# Patient Record
Sex: Female | Born: 1975 | Race: Black or African American | Hispanic: No | Marital: Single | Smoking: Former smoker
Health system: Southern US, Community
[De-identification: ages and names within clinical notes are randomized; demographics above are authoritative.]

## PROBLEM LIST (undated history)

## (undated) DIAGNOSIS — J36 Peritonsillar abscess: Secondary | ICD-10-CM

---

## 1998-02-07 ENCOUNTER — Inpatient Hospital Stay (HOSPITAL_COMMUNITY): Admission: AD | Admit: 1998-02-07 | Discharge: 1998-02-07 | Payer: Self-pay | Admitting: Obstetrics

## 1998-11-14 ENCOUNTER — Emergency Department (HOSPITAL_COMMUNITY): Admission: EM | Admit: 1998-11-14 | Discharge: 1998-11-14 | Payer: Self-pay | Admitting: *Deleted

## 1999-03-19 ENCOUNTER — Emergency Department (HOSPITAL_COMMUNITY): Admission: EM | Admit: 1999-03-19 | Discharge: 1999-03-19 | Payer: Self-pay | Admitting: Emergency Medicine

## 1999-12-14 ENCOUNTER — Emergency Department (HOSPITAL_COMMUNITY): Admission: EM | Admit: 1999-12-14 | Discharge: 1999-12-14 | Payer: Self-pay | Admitting: Emergency Medicine

## 1999-12-14 ENCOUNTER — Encounter: Payer: Self-pay | Admitting: Emergency Medicine

## 2000-04-27 ENCOUNTER — Emergency Department (HOSPITAL_COMMUNITY): Admission: EM | Admit: 2000-04-27 | Discharge: 2000-04-27 | Payer: Self-pay | Admitting: Emergency Medicine

## 2000-04-27 ENCOUNTER — Encounter: Payer: Self-pay | Admitting: Emergency Medicine

## 2000-07-06 ENCOUNTER — Ambulatory Visit (HOSPITAL_COMMUNITY): Admission: RE | Admit: 2000-07-06 | Discharge: 2000-07-06 | Payer: Self-pay | Admitting: *Deleted

## 2000-10-11 ENCOUNTER — Ambulatory Visit (HOSPITAL_COMMUNITY): Admission: RE | Admit: 2000-10-11 | Discharge: 2000-10-11 | Payer: Self-pay | Admitting: *Deleted

## 2000-12-03 ENCOUNTER — Inpatient Hospital Stay (HOSPITAL_COMMUNITY): Admission: AD | Admit: 2000-12-03 | Discharge: 2000-12-06 | Payer: Self-pay | Admitting: Obstetrics & Gynecology

## 2011-11-12 ENCOUNTER — Encounter (HOSPITAL_COMMUNITY): Payer: Self-pay | Admitting: Emergency Medicine

## 2011-11-12 ENCOUNTER — Emergency Department (HOSPITAL_COMMUNITY)
Admission: EM | Admit: 2011-11-12 | Discharge: 2011-11-12 | Disposition: A | Discharge status: Home / Self Care | Payer: Self-pay | Source: Home / Self Care | Attending: Emergency Medicine | Admitting: Emergency Medicine

## 2011-11-12 DIAGNOSIS — N12 Tubulo-interstitial nephritis, not specified as acute or chronic: Secondary | ICD-10-CM

## 2011-11-12 DIAGNOSIS — M549 Dorsalgia, unspecified: Secondary | ICD-10-CM

## 2011-11-12 MED ORDER — KETOROLAC TROMETHAMINE 60 MG/2ML IM SOLN
60.0000 mg | Freq: Once | INTRAMUSCULAR | Status: AC
  Administered 2011-11-12: 60 mg via INTRAMUSCULAR

## 2011-11-12 MED ORDER — NAPROXEN 500 MG PO TABS: 500.0000 mg | ORAL_TABLET | Freq: Two times a day (BID) | ORAL | Status: DC

## 2011-11-12 MED ORDER — KETOROLAC TROMETHAMINE 60 MG/2ML IM SOLN
INTRAMUSCULAR | Status: AC
  Filled 2011-11-12: qty 2

## 2011-11-12 MED ORDER — CIPROFLOXACIN HCL 500 MG PO TABS: 500.0000 mg | ORAL_TABLET | Freq: Two times a day (BID) | ORAL | Status: DC

## 2011-11-12 NOTE — ED Provider Notes (Signed)
Medical screening examination/treatment/procedure(s) were performed by non-physician practitioner and as supervising physician I was immediately available for consultation/collaboration.  Tamirra Sienkiewicz   Jeron Grahn, MD 11/12/11 1553 

## 2011-11-12 NOTE — ED Provider Notes (Signed)
History     CSN: [id number]  Arrival date & time 11/12/11  1238   None     Chief Complaint  Patient presents with  . Back Injury    pt slumped over bed. states that she was sweeping her stairs at home and her back gave out on her. "sharp shooting pains starting in mid/lower back"    (Consider location/radiation/quality/duration/timing/severity/associated sxs/prior treatment) Patient is a 36 y.o. female presenting with back pain. The history is provided by the patient.  Back Pain  This is a new problem. The current episode started more than 2 days ago (3 days ago, sweeping on steps). The problem has been gradually worsening. The pain is associated with no known injury. The pain is present in the lumbar spine. The quality of the pain is described as shooting (sharp). The pain does not radiate. The pain is moderate. The symptoms are aggravated by bending, twisting and certain positions. The pain is the same all the time. Stiffness is present in the morning. Pertinent negatives include no numbness, no headaches, no abdominal pain, no bowel incontinence, no perianal numbness, no bladder incontinence, no dysuria, no pelvic pain, no leg pain, no paresthesias, no paresis, no tingling and no weakness. She has tried NSAIDs for the symptoms. The treatment provided mild relief. 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  Admits to urinary frequency and pelvic pressure. History reviewed. No pertinent past medical history.  Past Surgical History  Procedure Date  . Cesarean section     History reviewed. No pertinent family history.  History  Substance Use Topics  . Smoking status: Current Every Day Smoker -- 0.5 packs/day    Types: Cigarettes  . Smokeless tobacco: Not on file  . Alcohol Use: Yes     Comment: occasional    OB History    Grav Para Term Preterm Abortions TAB SAB Ect Mult Living                  Review of Systems  Constitutional: Negative.     HENT: Negative.   Respiratory: Negative.   Cardiovascular: Negative.   Gastrointestinal: Negative for abdominal pain and bowel incontinence.  Genitourinary: Negative.  Negative for bladder incontinence, dysuria and pelvic pain.  Musculoskeletal: Positive for back pain and arthralgias.  Skin: Negative.   Neurological: Negative.  Negative for tingling, weakness, numbness, headaches and paresthesias.    Allergies  Review of patient's allergies indicates no known allergies.  Home Medications   Current Outpatient Rx  Name  Route  Sig  Dispense  Refill  . CIPROFLOXACIN HCL 500 MG PO TABS   Oral   Take 1 tablet (500 mg total) by mouth every 12 (twelve) hours.   14 tablet   0   . NAPROXEN 500 MG PO TABS   Oral   Take 1 tablet (500 mg total) by mouth 2 (two) times daily.   30 tablet   0     BP 148/75  Pulse 76  Temp 99.5 F (37.5 C) (Oral)  Resp 20  SpO2 100%  LMP 10/16/2011  Physical Exam  Nursing note and vitals reviewed. Constitutional: She is oriented to person, place, and time. Vital signs are normal. She appears well-developed and well-nourished. She is active and cooperative.  HENT:  Head: Normocephalic.  Mouth/Throat: Oropharynx is clear and moist. No oropharyngeal exudate.  Eyes: Conjunctivae normal are normal. Pupils are equal, round, and reactive to light. No scleral icterus.  Neck: Trachea  normal and normal range of motion. Neck supple.  Cardiovascular: Normal rate, regular rhythm, normal heart sounds and intact distal pulses.   No murmur heard. Pulmonary/Chest: Effort normal and breath sounds normal.  Abdominal: Soft. Bowel sounds are normal. There is no tenderness. There is CVA tenderness. There is no rebound.       Right cva tenderness  Lymphadenopathy:    She has no cervical adenopathy.  Neurological: She is alert and oriented to person, place, and time. No cranial nerve deficit or sensory deficit.  Skin: Skin is warm and dry.  Psychiatric: She has a  normal mood and affect. Her speech is normal and behavior is normal. Judgment and thought content normal. Cognition and memory are normal.    ED Course  Procedures (including critical care time)  Labs Reviewed - No data to display No results found.  uine dip-bil small, ket trace, pro 30, leu trace.  1. Pyelonephritis   2. Back pain       MDM  Increase fluid intake, follow up with pcp if symptoms are not improving.          Johnsie Kindred, NP 11/12/11 1454  Johnsie Kindred, NP 11/12/11 1507

## 2011-11-12 NOTE — ED Notes (Signed)
Pt c/o mid to lower back pain since Friday. "back gave out when sweeping stairs at home"  Pain is a sharp shooting pain. Pt has tried otc with no relief of pain.   Severe pain with any movement.  Pt is slumped over exam table unable to move.

## 2011-11-12 NOTE — ED Notes (Signed)
Waiting discharge papers 

## 2011-11-13 LAB — POCT URINALYSIS DIP (DEVICE)
Hgb urine dipstick: NEGATIVE
Protein, ur: 30 mg/dL — AB
Specific Gravity, Urine: 1.025 (ref 1.005–1.030)

## 2012-11-05 ENCOUNTER — Encounter (HOSPITAL_COMMUNITY): Payer: Self-pay | Admitting: Emergency Medicine

## 2012-11-05 ENCOUNTER — Emergency Department (HOSPITAL_COMMUNITY)
Admission: EM | Admit: 2012-11-05 | Discharge: 2012-11-05 | Disposition: A | Discharge status: Home / Self Care | Payer: Self-pay | Attending: Emergency Medicine | Admitting: Emergency Medicine

## 2012-11-05 ENCOUNTER — Emergency Department (INDEPENDENT_AMBULATORY_CARE_PROVIDER_SITE_OTHER)
Admission: EM | Admit: 2012-11-05 | Discharge: 2012-11-05 | Disposition: A | Discharge status: Nursing Home (Includes ICF) | Payer: Self-pay | Source: Home / Self Care | Attending: Family Medicine | Admitting: Family Medicine

## 2012-11-05 DIAGNOSIS — R1310 Dysphagia, unspecified: Secondary | ICD-10-CM | POA: Insufficient documentation

## 2012-11-05 DIAGNOSIS — J3489 Other specified disorders of nose and nasal sinuses: Secondary | ICD-10-CM | POA: Insufficient documentation

## 2012-11-05 DIAGNOSIS — F17200 Nicotine dependence, unspecified, uncomplicated: Secondary | ICD-10-CM | POA: Insufficient documentation

## 2012-11-05 DIAGNOSIS — R509 Fever, unspecified: Secondary | ICD-10-CM | POA: Insufficient documentation

## 2012-11-05 DIAGNOSIS — J36 Peritonsillar abscess: Secondary | ICD-10-CM

## 2012-11-05 DIAGNOSIS — J390 Retropharyngeal and parapharyngeal abscess: Secondary | ICD-10-CM

## 2012-11-05 HISTORY — DX: Peritonsillar abscess: J36

## 2012-11-05 LAB — POCT RAPID STREP A: Streptococcus, Group A Screen (Direct): POSITIVE — AB

## 2012-11-05 MED ORDER — SODIUM CHLORIDE 0.9 % IV BOLUS (SEPSIS)
1000.0000 mL | Freq: Once | INTRAVENOUS | Status: AC
  Administered 2012-11-05: 1000 mL via INTRAVENOUS

## 2012-11-05 MED ORDER — OXYCODONE-ACETAMINOPHEN 5-325 MG PO TABS: 1.0000 | ORAL_TABLET | ORAL | Status: DC | PRN

## 2012-11-05 MED ORDER — CLINDAMYCIN PHOSPHATE 600 MG/50ML IV SOLN
600.0000 mg | Freq: Once | INTRAVENOUS | Status: AC
  Administered 2012-11-05: 600 mg via INTRAVENOUS
  Filled 2012-11-05: qty 50

## 2012-11-05 MED ORDER — LIDOCAINE HCL (PF) 2 % IJ SOLN: 2.0000 mL | Freq: Once | INTRAMUSCULAR | Status: DC

## 2012-11-05 MED ORDER — KETOROLAC TROMETHAMINE 30 MG/ML IJ SOLN
30.0000 mg | Freq: Once | INTRAMUSCULAR | Status: AC
  Administered 2012-11-05: 30 mg via INTRAVENOUS
  Filled 2012-11-05: qty 1

## 2012-11-05 MED ORDER — DEXAMETHASONE SODIUM PHOSPHATE 4 MG/ML IJ SOLN
4.0000 mg | Freq: Once | INTRAMUSCULAR | Status: AC
  Administered 2012-11-05: 4 mg via INTRAVENOUS
  Filled 2012-11-05: qty 1

## 2012-11-05 MED ORDER — PENICILLIN G BENZATHINE 1200000 UNIT/2ML IM SUSP: 1.2000 10*6.[IU] | Freq: Once | INTRAMUSCULAR | Status: DC

## 2012-11-05 MED ORDER — FENTANYL CITRATE 0.05 MG/ML IJ SOLN
50.0000 ug | Freq: Once | INTRAMUSCULAR | Status: AC
  Administered 2012-11-05: 50 ug via INTRAVENOUS
  Filled 2012-11-05: qty 2

## 2012-11-05 MED ORDER — CLINDAMYCIN HCL 150 MG PO CAPS: 300.0000 mg | ORAL_CAPSULE | Freq: Four times a day (QID) | ORAL | Status: DC

## 2012-11-05 MED ORDER — ACETAMINOPHEN 325 MG PO TABS
650.0000 mg | ORAL_TABLET | Freq: Once | ORAL | Status: AC
  Administered 2012-11-05: 650 mg via ORAL
  Filled 2012-11-05: qty 2

## 2012-11-05 NOTE — ED Notes (Signed)
[facility], having sore throat, bodyaches and fever x 3-4 days, not eating. +strep at [facility] but sent here due to possible peritonsillar abscess, has hx of same. Pt denies any sob but pt barely able to speak or swallow saliva due to pain. Airway intact at this time.

## 2012-11-05 NOTE — ED Provider Notes (Signed)
CSN: [id number]     Arrival date & time 11/05/12  1743 History   First MD Initiated Contact with Patient 11/05/12 1856     Chief Complaint  Patient presents with  . Sore Throat   (Consider location/radiation/quality/duration/timing/severity/associated sxs/prior Treatment) The history is provided by the patient.   Melissa Soto is a 37 y.o. female patient with flulike symptoms for approximately 5 days, states sore throat for 2 days. States that it is painful to swallow mainly in the right side. Patient went to urgent care today and was told that she had strep throat and possible tonsillar abscess. Patient was sent here for further evaluation. Patient states she has history of the same and states that she has had to have a tonsillar abscesses drained in the past. Patient states that she still able to speak and swallow, denies any respiratory problems. Patient denies taking any medications prior to coming in. States swallowing and speaking making her symptoms worse. Denies anything making it better.  Past Medical History  Diagnosis Date  . Peritonsillar abscess    Past Surgical History  Procedure Laterality Date  . Cesarean section     History reviewed. No pertinent family history. History  Substance Use Topics  . Smoking status: Current Every Day Smoker -- 0.50 packs/day    Types: Cigarettes  . Smokeless tobacco: Not on file  . Alcohol Use: Yes     Comment: occasional   OB History   Grav Para Term Preterm Abortions TAB SAB Ect Mult Living                 Review of Systems  Constitutional: Positive for fever and chills.  HENT: Positive for congestion, sore throat, trouble swallowing and voice change. Negative for dental problem, drooling and postnasal drip.   Respiratory: Negative for cough, chest tightness and shortness of breath.   Cardiovascular: Negative for chest pain, palpitations and leg swelling.  Gastrointestinal: Negative for vomiting.  Genitourinary: Negative for  dysuria, flank pain, vaginal bleeding, vaginal discharge, vaginal pain and pelvic pain.  Musculoskeletal: Negative for arthralgias, myalgias, neck pain and neck stiffness.  Skin: Negative for rash.  Neurological: Negative for dizziness, weakness and headaches.  All other systems reviewed and are negative.    Allergies  Review of patient's allergies indicates no known allergies.  Home Medications  No current outpatient prescriptions on file. BP 138/63  Pulse 90  Temp(Src) 99.7 F (37.6 C) (Oral)  Resp 16  Wt 231 lb 9 oz (105.036 kg)  SpO2 100%  LMP 11/05/2012 Physical Exam  Nursing note and vitals reviewed. Constitutional: She is oriented to person, place, and time. She appears well-developed and well-nourished. No distress.  HENT:  Head: Normocephalic and atraumatic.  Right Ear: External ear normal.  Left Ear: External ear normal.  Nose: Nose normal.  Mouth/Throat: Oropharyngeal exudate present.  The right tonsil is markedly enlarged, exudate present. It extends over and across midline. Uvula is deviated to the left. Trismus is present. No swelling under the tongue or floor submandibular area  Eyes: Conjunctivae are normal.  Neck: Normal range of motion. Neck supple.  Cardiovascular: Normal rate, regular rhythm and normal heart sounds.   Pulmonary/Chest: Effort normal and breath sounds normal. No respiratory distress. She has no wheezes. She has no rales.  Musculoskeletal: She exhibits no edema.  Neurological: She is alert and oriented to person, place, and time.  Skin: Skin is warm and dry.  Psychiatric: She has a normal mood and affect. Her behavior  is normal.    ED Course  Procedures (including critical care time) Labs Review Labs Reviewed - No data to display Imaging Review No results found.  EKG Interpretation   None       MDM   1. Peritonsillar abscess     Patient's with a right peritonsillar abscess based on the exam. Incised and drained at bedside by  Dr. Jodi Mourning. 8 cc of purulent drainage obtained. Patient was hydrated, clindamycin 600mg  IV given, pain medication and Decadron given. Patient feels much better. She will be discharged home with followup with ear nose throat physicians. Her strep was positive. Home on Clindamycin.  Filed Vitals:   11/05/12 2130 11/05/12 2215 11/05/12 2300 11/05/12 2336  BP: 145/74 136/70 134/64   Pulse: 78 84 83   Temp:    99.8 F (37.7 C)  TempSrc:    Oral  Resp:      Weight:      SpO2: 100% 100% 99%        Lottie Mussel, PA-C 11/06/12 0133  Medical screening examination/treatment/procedure(s) were conducted as a shared visit with non-physician practitioner(s) or resident and myself. I personally evaluated the patient during the encounter and agree with the findings and plan unless otherwise indicated.  I have personally reviewed any xrays and/ or EKG's with the provider and I agree with interpretation.  Sore throat for 3-4 days, similar to previous pharyngitis/ abscess. Hx of PTA. Pain with swallowing. Exam neck supple, protrusion right posterior pharynx with tonsillar exudate, cervical lymphadenopathy, mild trismus. No tongue or submandibular swelling. Clinically PTA, plan for needle drainage with 18 g.  Consent. [place] at bedside.  8 ml pus removed. Pt improved significantly in ED.   EMERGENCY DEPARTMENT US SOFT TISSUE INTERPRETATION "Study: Limited Soft Tissue Ultrasound"  INDICATIONS: Pain and Soft tissue infection peritonsillar abscess Multiple views of the body part were obtained in real-time with a multi-frequency linear probe PERFORMED BY:  Myself IMAGES ARCHIVED?: Yes SIDE:Right  BODY PART:Other soft tisse (comment in note) pharynx FINDINGS: Abcess present INTERPRETATION:  Abcess present PTA  INCISION AND DRAINAGE PERITONSILLAR ABSCESS DRAINAGE Performed by: Enid Skeens Consent: Verbal consent obtained. Risks and benefits: risks, benefits and alternatives were  discussed Type: abscess  Body area: right peritonsillar Anesthesia: local infiltration Incision was made with a scalpel. Local anesthetic: lidocaine Anesthetic total: lidocaine neb with local hurricaine spray Complexity: complex 18 gauge needle wit protective cap cut at 1.5 cm from needle tip advanced parallel to midline of body.  8 ml of pus withdrawn on two attempts. Drainage: 8 ml pus  Patient tolerance: Patient tolerated the procedure well with no immediate complications.  Peritonsillar abscess, Incision and drainage     Enid Skeens, MD 11/06/12 [phone]

## 2012-11-05 NOTE — ED Notes (Signed)
Patient making phone calls to family member, letting them know  what's going on .

## 2012-11-05 NOTE — ED Provider Notes (Signed)
Melissa Soto is a 37 y.o. female who presents to Urgent Care today for sore throat subjective fever and body aches starting 3 days ago. Patient has pain and swelling. She has a history of right peritonsillar abscess. She denies any nausea vomiting diarrhea or trouble breathing. She feels well otherwise.   No past medical history on file. History  Substance Use Topics  . Smoking status: Current Every Day Smoker -- 0.50 packs/day    Types: Cigarettes  . Smokeless tobacco: Not on file  . Alcohol Use: Yes     Comment: occasional   ROS as above Medications reviewed. No current facility-administered medications for this encounter.   Current Outpatient Prescriptions  Medication Sig Dispense Refill  . ciprofloxacin (CIPRO) 500 MG tablet Take 1 tablet (500 mg total) by mouth every 12 (twelve) hours.  14 tablet  0  . naproxen (NAPROSYN) 500 MG tablet Take 1 tablet (500 mg total) by mouth 2 (two) times daily.  30 tablet  0    Exam:  BP 124/86  Pulse 82  Temp(Src) 98.8 F (37.1 C) (Oral)  Resp 16  SpO2 100% Gen: Well NAD HEENT: EOMI,  MMM, right peritonsillar area significantly swollen with midline deviation of the uvula. The swelling seems to extend posterior possibly into the retropharyngeal space.  Lungs: CTABL Nl WOB Heart: RRR no MRG Abd: NABS, NT, ND Exts: Non edematous BL  LE, warm and well perfused.   Results for orders placed during the hospital encounter of 11/05/12 (from the past 24 hour(s))  POCT RAPID STREP A ([facility] ONLY)     Status: Abnormal   Collection Time    11/05/12  4:29 PM      Result Value Range   Streptococcus, Group A Screen (Direct) POSITIVE (*) NEGATIVE   No results found.  Assessment and Plan: 37 y.o. female with right peritonsillar abscess versus retropharyngeal abscess.  Plan: Transfer to the ER via shuttle for imaging and IV antibiotics. Call ENT if needed Dr. Jearld Fenton is on call [phone]. Discussed warning signs or symptoms. Please see  discharge instructions. Patient expresses understanding.      Rodolph Bong, MD 11/05/12 [phone]

## 2014-12-01 ENCOUNTER — Emergency Department (INDEPENDENT_AMBULATORY_CARE_PROVIDER_SITE_OTHER): Payer: Medicaid Other

## 2014-12-01 ENCOUNTER — Emergency Department (HOSPITAL_COMMUNITY)
Admission: EM | Admit: 2014-12-01 | Discharge: 2014-12-01 | Disposition: A | Discharge status: Home / Self Care | Payer: Medicaid Other | Source: Home / Self Care | Attending: Emergency Medicine | Admitting: Emergency Medicine

## 2014-12-01 ENCOUNTER — Encounter (HOSPITAL_COMMUNITY): Payer: Self-pay | Admitting: *Deleted

## 2014-12-01 DIAGNOSIS — M47816 Spondylosis without myelopathy or radiculopathy, lumbar region: Secondary | ICD-10-CM

## 2014-12-01 DIAGNOSIS — M479 Spondylosis, unspecified: Secondary | ICD-10-CM

## 2014-12-01 DIAGNOSIS — M62830 Muscle spasm of back: Secondary | ICD-10-CM | POA: Diagnosis not present

## 2014-12-01 MED ORDER — HYDROCODONE-ACETAMINOPHEN 5-325 MG PO TABS: 1.0000 | ORAL_TABLET | Freq: Four times a day (QID) | ORAL | Status: DC | PRN

## 2014-12-01 MED ORDER — MELOXICAM 15 MG PO TABS: 15.0000 mg | ORAL_TABLET | Freq: Every day | ORAL | Status: DC

## 2014-12-01 MED ORDER — CYCLOBENZAPRINE HCL 5 MG PO TABS: 5.0000 mg | ORAL_TABLET | Freq: Three times a day (TID) | ORAL | Status: DC | PRN
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
## 2014-12-01 NOTE — ED Notes (Signed)
Pt  Reports       Back  Pain       X   sev  Days         Pt   denys  Any   Urinary  Symptoms          Pt  Ambulated  To  Room  With a  Steady  Fluid  gait

## 2014-12-01 NOTE — Discharge Instructions (Signed)
You have muscle spasm in your mid back. Your x-ray shows signs of early arthritis in your lower back. Take meloxicam daily for the next 2 weeks, then as needed. Do not take ibuprofen with this medicine. Take Flexeril 3 times a day as needed for muscle spasms. This medicine will make you drowsy. Apply heat to your mid back as often as you can over the next 2 days. Take Norco every 4-6 hours as needed for pain. You should see some improvement in the next 2-3 days, but this will likely take 1-2 weeks to fully resolve.

## 2014-12-09 ENCOUNTER — Encounter (HOSPITAL_COMMUNITY): Payer: Self-pay | Admitting: Emergency Medicine

## 2014-12-09 ENCOUNTER — Emergency Department (INDEPENDENT_AMBULATORY_CARE_PROVIDER_SITE_OTHER)
Admission: EM | Admit: 2014-12-09 | Discharge: 2014-12-09 | Disposition: A | Discharge status: Home / Self Care | Payer: Medicaid Other | Source: Home / Self Care | Attending: Emergency Medicine | Admitting: Emergency Medicine

## 2014-12-09 ENCOUNTER — Emergency Department (INDEPENDENT_AMBULATORY_CARE_PROVIDER_SITE_OTHER): Payer: Medicaid Other

## 2014-12-09 DIAGNOSIS — M545 Low back pain, unspecified: Secondary | ICD-10-CM

## 2014-12-09 LAB — POCT URINALYSIS DIP (DEVICE)
BILIRUBIN URINE: NEGATIVE
Glucose, UA: NEGATIVE mg/dL
HGB URINE DIPSTICK: NEGATIVE
KETONES UR: NEGATIVE mg/dL
LEUKOCYTES UA: NEGATIVE
NITRITE: NEGATIVE
PH: 6.5 (ref 5.0–8.0)
Protein, ur: NEGATIVE mg/dL
SPECIFIC GRAVITY, URINE: 1.02 (ref 1.005–1.030)
Urobilinogen, UA: 0.2 mg/dL (ref 0.0–1.0)

## 2014-12-09 MED ORDER — PREDNISONE 50 MG PO TABS: ORAL_TABLET | ORAL | Status: DC

## 2014-12-09 MED ORDER — HYDROCODONE-ACETAMINOPHEN 5-325 MG PO TABS: 1.0000 | ORAL_TABLET | Freq: Four times a day (QID) | ORAL | Status: DC | PRN

## 2014-12-09 NOTE — ED Notes (Signed)
[facility] a [facility] of lower right back pain that started about 10 days ago. The patient [facility] She stated that she is still having the pain and she is out of the pain medicine and the meloxicam is not working.

## 2014-12-09 NOTE — Discharge Instructions (Signed)
Your pain seems to be coming from your SI joint. Take prednisone daily for the next 5 days. I have refilled the pain medication. If this is not improving in the next week, please follow-up with Dr. Eulah PontMurphy in orthopedics.

## 2014-12-09 NOTE — ED Provider Notes (Signed)
CSN: [id number]     Arrival date & time 12/09/14  1713 History   First MD Initiated Contact with Patient 12/09/14 1747     Chief Complaint  Patient presents with  . Back Pain   (Consider location/radiation/quality/duration/timing/severity/associated sxs/prior Treatment) HPI  She is a 39 year old woman here for follow-up of back pain. She was at the urgent care approximately 6 days ago for mid back pain. She was treated with meloxicam, Flexeril, and Norco. She states the spasming part of her pain has resolved, but she hasn't ache that has settled into her right lower back. She states if she sits for any length of time she gets very stiff and it is difficult for her to stand up. The pain does not radiate. The meloxicam does not help the pain.  Past Medical History  Diagnosis Date  . Peritonsillar abscess    Past Surgical History  Procedure Laterality Date  . Cesarean section     History reviewed. No pertinent family history. Social History  Substance Use Topics  . Smoking status: Current Every Day Smoker -- 0.50 packs/day    Types: Cigarettes  . Smokeless tobacco: None  . Alcohol Use: Yes     Comment: occasional   OB History    No data available     Review of Systems As in history of present illness Allergies  Review of patient's allergies indicates no known allergies.  Home Medications   Prior to Admission medications   Medication Sig Start Date End Date Taking? Authorizing Provider  cyclobenzaprine (FLEXERIL) 5 MG tablet Take 1-2 tablets (5-10 mg total) by mouth 3 (three) times daily as needed for muscle spasms. 12/01/14   Charm RingsErin J Arriyanna Mersch, MD  HYDROcodone-acetaminophen (NORCO) 5-325 MG tablet Take 1 tablet by mouth every 6 (six) hours as needed for moderate pain. 12/09/14   Charm RingsErin J Laurie Penado, MD  meloxicam (MOBIC) 15 MG tablet Take 1 tablet (15 mg total) by mouth daily. 12/01/14   Charm RingsErin J Emery Binz, MD  predniSONE (DELTASONE) 50 MG tablet Take 1 pill daily for 5 days. 12/09/14   Charm RingsErin  J Eldoris Beiser, MD   Meds Ordered and Administered this Visit  Medications - No data to display  BP 149/96 mmHg  Pulse 67  Temp(Src) 98.3 F (36.8 C) (Oral)  Resp 16  SpO2 98%  LMP 11/22/2014 No data found.   Physical Exam  Constitutional: She is oriented to person, place, and time. She appears well-developed and well-nourished. No distress.  Cardiovascular: Normal rate.   Pulmonary/Chest: Effort normal.  Musculoskeletal:  Back: No vertebral tenderness or step-offs. No appreciable muscle spasm. She is tender to palpation in the right SI area.  Neurological: She is alert and oriented to person, place, and time.    ED Course  Procedures (including critical care time)  Labs Review Labs Reviewed  POCT URINALYSIS DIP (DEVICE)    Imaging Review Dg Lumbar Spine Complete  12/09/2014  CLINICAL DATA:  Back pain for weeks.  Right low back pain. EXAM: LUMBAR SPINE - COMPLETE 4+ VIEW COMPARISON:  12/01/2014 FINDINGS: There is no evidence of lumbar spine fracture. Alignment is normal. Mild degenerative disc disease at L5-S1. 3 mm calcification projecting over the right psoas muscle which may reflect a ureteral calculus. IMPRESSION: 1.  No acute osseous injury of the lumbar spine. 2. 3 mm calcification projecting over the right psoas muscle which may reflect a ureteral calculus. Electronically Signed   By: Elige KoHetal  Patel   On: 12/09/2014 19:15  MDM   1. Right-sided low back pain without sciatica    X-ray raised the possibility of a right kidney stone. Urinalysis is negative for hematuria. Will treat with prednisone. I refilled her Norco. If not improving, follow-up with orthopedics.    Charm Rings, MD 12/09/14 [phone]

## 2015-03-21 ENCOUNTER — Emergency Department (HOSPITAL_COMMUNITY)
Admission: EM | Admit: 2015-03-21 | Discharge: 2015-03-21 | Disposition: A | Discharge status: Home / Self Care | Payer: Medicaid Other | Attending: Emergency Medicine | Admitting: Emergency Medicine

## 2015-03-21 ENCOUNTER — Encounter (HOSPITAL_COMMUNITY): Payer: Self-pay | Admitting: *Deleted

## 2015-03-21 DIAGNOSIS — Z3202 Encounter for pregnancy test, result negative: Secondary | ICD-10-CM | POA: Diagnosis not present

## 2015-03-21 DIAGNOSIS — Z79899 Other long term (current) drug therapy: Secondary | ICD-10-CM | POA: Diagnosis not present

## 2015-03-21 DIAGNOSIS — K921 Melena: Secondary | ICD-10-CM | POA: Insufficient documentation

## 2015-03-21 DIAGNOSIS — Z8709 Personal history of other diseases of the respiratory system: Secondary | ICD-10-CM | POA: Diagnosis not present

## 2015-03-21 DIAGNOSIS — M5440 Lumbago with sciatica, unspecified side: Secondary | ICD-10-CM

## 2015-03-21 DIAGNOSIS — Z791 Long term (current) use of non-steroidal anti-inflammatories (NSAID): Secondary | ICD-10-CM | POA: Diagnosis not present

## 2015-03-21 DIAGNOSIS — F17210 Nicotine dependence, cigarettes, uncomplicated: Secondary | ICD-10-CM | POA: Insufficient documentation

## 2015-03-21 DIAGNOSIS — M545 Low back pain: Secondary | ICD-10-CM | POA: Diagnosis present

## 2015-03-21 LAB — COMPREHENSIVE METABOLIC PANEL
ALT: 12 U/L — ABNORMAL LOW (ref 14–54)
ANION GAP: 9 (ref 5–15)
AST: 19 U/L (ref 15–41)
Albumin: 3.3 g/dL — ABNORMAL LOW (ref 3.5–5.0)
Alkaline Phosphatase: 63 U/L (ref 38–126)
BILIRUBIN TOTAL: 0.4 mg/dL (ref 0.3–1.2)
BUN: 10 mg/dL (ref 6–20)
CHLORIDE: 106 mmol/L (ref 101–111)
CO2: 25 mmol/L (ref 22–32)
Calcium: 8.8 mg/dL — ABNORMAL LOW (ref 8.9–10.3)
Creatinine, Ser: 0.68 mg/dL (ref 0.44–1.00)
GFR calc Af Amer: >60 mL/min (ref >60)
Glucose, Bld: 111 mg/dL — ABNORMAL HIGH (ref 65–99)
POTASSIUM: 3.3 mmol/L — AB (ref 3.5–5.1)
Sodium: 140 mmol/L (ref 135–145)
TOTAL PROTEIN: 8 g/dL (ref 6.5–8.1)

## 2015-03-21 LAB — POC URINE PREG, ED: Preg Test, Ur: NEGATIVE

## 2015-03-21 LAB — LIPASE, BLOOD: LIPASE: 32 U/L (ref 11–51)

## 2015-03-21 LAB — CBC
HEMATOCRIT: 26.6 % — AB (ref 36.0–46.0)
HEMOGLOBIN: 7.1 g/dL — AB (ref 12.0–15.0)
MCH: 16.4 pg — ABNORMAL LOW (ref 26.0–34.0)
MCHC: 26.7 g/dL — ABNORMAL LOW (ref 30.0–36.0)
MCV: 61.6 fL — AB (ref 78.0–100.0)
Platelets: 558 10*3/uL — ABNORMAL HIGH (ref 150–400)
RBC: 4.32 MIL/uL (ref 3.87–5.11)
RDW: 20 % — ABNORMAL HIGH (ref 11.5–15.5)
WBC: 7.1 10*3/uL (ref 4.0–10.5)

## 2015-03-21 LAB — URINALYSIS, ROUTINE W REFLEX MICROSCOPIC
Bilirubin Urine: NEGATIVE
GLUCOSE, UA: NEGATIVE mg/dL
Hgb urine dipstick: NEGATIVE
KETONES UR: NEGATIVE mg/dL
LEUKOCYTES UA: NEGATIVE
NITRITE: NEGATIVE
PH: 7 (ref 5.0–8.0)
Protein, ur: NEGATIVE mg/dL
SPECIFIC GRAVITY, URINE: 1.023 (ref 1.005–1.030)

## 2015-03-21 LAB — POC OCCULT BLOOD, ED: FECAL OCCULT BLD: NEGATIVE

## 2015-03-21 MED ORDER — OXYCODONE-ACETAMINOPHEN 5-325 MG PO TABS: 1.0000 | ORAL_TABLET | Freq: Four times a day (QID) | ORAL | Status: DC | PRN

## 2015-03-21 MED ORDER — OXYCODONE-ACETAMINOPHEN 5-325 MG PO TABS
2.0000 | ORAL_TABLET | Freq: Once | ORAL | Status: AC
  Administered 2015-03-21: 2 via ORAL
  Filled 2015-03-21: qty 2

## 2015-03-21 MED ORDER — CYCLOBENZAPRINE HCL 5 MG PO TABS: 5.0000 mg | ORAL_TABLET | Freq: Three times a day (TID) | ORAL | Status: DC | PRN

## 2015-03-21 MED ORDER — HYDROMORPHONE HCL 1 MG/ML IJ SOLN
1.0000 mg | Freq: Once | INTRAMUSCULAR | Status: AC
  Administered 2015-03-21: 1 mg via INTRAMUSCULAR
  Filled 2015-03-21: qty 1

## 2015-03-21 MED ORDER — KETOROLAC TROMETHAMINE 60 MG/2ML IM SOLN
60.0000 mg | Freq: Once | INTRAMUSCULAR | Status: AC
  Administered 2015-03-21: 60 mg via INTRAMUSCULAR
  Filled 2015-03-21: qty 2

## 2015-03-21 MED ORDER — PREDNISONE 20 MG PO TABS: 40.0000 mg | ORAL_TABLET | Freq: Every day | ORAL | Status: DC

## 2015-03-21 MED ORDER — DEXAMETHASONE SODIUM PHOSPHATE 10 MG/ML IJ SOLN
10.0000 mg | Freq: Once | INTRAMUSCULAR | Status: AC
  Administered 2015-03-21: 10 mg via INTRAMUSCULAR
  Filled 2015-03-21: qty 1

## 2015-03-21 NOTE — Discharge Instructions (Signed)
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
## 2015-03-21 NOTE — ED Notes (Signed)
Type and screen cancelled per provider request.  Pt reports being Jehovah Witness.  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

## 2018-10-18 ENCOUNTER — Ambulatory Visit (HOSPITAL_COMMUNITY)
Admission: EM | Admit: 2018-10-18 | Discharge: 2018-10-18 | Disposition: A | Discharge status: Home / Self Care | Payer: Medicaid Other | Attending: Emergency Medicine | Admitting: Emergency Medicine

## 2018-10-18 ENCOUNTER — Other Ambulatory Visit: Payer: Self-pay

## 2018-10-18 ENCOUNTER — Encounter (HOSPITAL_COMMUNITY): Payer: Self-pay

## 2018-10-18 DIAGNOSIS — Z20828 Contact with and (suspected) exposure to other viral communicable diseases: Secondary | ICD-10-CM | POA: Insufficient documentation

## 2018-10-18 DIAGNOSIS — Z20822 Contact with and (suspected) exposure to covid-19: Secondary | ICD-10-CM

## 2018-10-18 NOTE — ED Provider Notes (Signed)
HPI  SUBJECTIVE:  Melissa Soto is a 43 y.o. female who presents with COVID exposure and testing.  States that she has a close coworker who tested positive for COVID.  States that she was exposed on 10/4. She denies fever, nasal congestion, rhinorrhea, body aches, headaches, sore throat, loss of sense of smell or taste, cough, shortness of breath, nausea, vomiting, abdominal pain, diarrhea.  She has not tried anything because she is asymptomatic.  There are no aggravating or alleviating factors.  Past medical history negative for asthma, emphysema, COPD, smoking, diabetes, hypertension, chronic kidney disease, HIV, immunocompromise.  PMD: [facility].    Past Medical History:  Diagnosis Date  . Peritonsillar abscess     Past Surgical History:  Procedure Laterality Date  . CESAREAN SECTION      Family History  Problem Relation Age of Onset  . Healthy Mother   . Hypertension Father     Social History   Tobacco Use  . Smoking status: Current Every Day Smoker    Packs/day: 0.50    Types: Cigarettes  . Smokeless tobacco: Never Used  Substance Use Topics  . Alcohol use: Yes    Comment: occasional  . Drug use: Not on file    No current facility-administered medications for this encounter.   Current Outpatient Medications:  .  acetaminophen (TYLENOL) 650 MG CR tablet, Take 1,300 mg by mouth daily as needed for pain., Disp: , Rfl:  .  Aspirin-Caffeine 500-32.5 MG TABS, Take 3 tablets by mouth 2 (two) times daily as needed (for pain)., Disp: , Rfl:  .  DULoxetine (CYMBALTA) 30 MG capsule, Take 30 mg by mouth daily., Disp: , Rfl:   No Known Allergies   ROS  As noted in HPI.   Physical Exam  BP (!) 154/85 (BP Location: Left Arm)   Pulse 71   Temp 98.6 F (37 C) (Oral)   Resp 16   SpO2 100%   Constitutional: Well developed, well nourished, no acute distress Eyes: PERRL, EOMI, conjunctiva normal bilaterally HENT: Normocephalic, atraumatic,mucus  membranes moist Respiratory: Clear to auscultation bilaterally, no rales, no wheezing, no rhonchi Cardiovascular: Normal rate and rhythm, no murmurs, no gallops, no rubs GI: Nondistended Back: no CVAT skin: No rash, skin intact Musculoskeletal: No edema, no tenderness, no deformities Neurologic: Alert & oriented x 3, CN III-XII grossly intact, no motor deficits, sensation grossly intact Psychiatric: Speech and behavior appropriate   ED Course   Medications - No data to display  Orders Placed This Encounter  Procedures  . Novel Coronavirus, NAA (Hosp order, Send-out to Ref Lab; TAT 18-24 hrs    Standing Status:   Standing    Number of Occurrences:   1    Order Specific Question:   Is this test for diagnosis or screening    Answer:   Screening    Order Specific Question:   Symptomatic for COVID-19 as defined by CDC    Answer:   No    Order Specific Question:   Hospitalized for COVID-19    Answer:   No    Order Specific Question:   Admitted to ICU for COVID-19    Answer:   No    Order Specific Question:   Previously tested for COVID-19    Answer:   No    Order Specific Question:   Resident in a congregate (group) care setting    Answer:   No    Order Specific Question:   Employed in healthcare  setting    Answer:   No    Order Specific Question:   Pregnant    Answer:   No   No results found for this or any previous visit (from the past 24 hour(s)). No results found.  ED Clinical Impression  1. Close exposure to COVID-19 virus      ED Assessment/Plan  COVID test sent.  2-day work note to allow for test results to return.  May return pending negative COVID result.  No orders of the defined types were placed in this encounter.   *This clinic note was created using Dragon dictation software. Therefore, there may be occasional mistakes despite careful proofreading.  ?   Domenick Gong, MD 10/19/18 [phone]

## 2018-10-18 NOTE — ED Triage Notes (Signed)
Patient presents to Urgent Care with complaints of needing COVID testing since being around a positive coworker on 10/2. Patient reports she has no symptoms at this time.

## 2018-10-20 LAB — NOVEL CORONAVIRUS, NAA (HOSP ORDER, SEND-OUT TO REF LAB; TAT 18-24 HRS): SARS-CoV-2, NAA: NOT DETECTED

## 2019-04-05 ENCOUNTER — Telehealth (HOSPITAL_BASED_OUTPATIENT_CLINIC_OR_DEPARTMENT_OTHER): Payer: Self-pay | Admitting: *Deleted

## 2019-04-05 ENCOUNTER — Other Ambulatory Visit: Payer: Self-pay

## 2019-04-05 ENCOUNTER — Emergency Department (HOSPITAL_COMMUNITY)
Admission: EM | Admit: 2019-04-05 | Discharge: 2019-04-05 | Disposition: A | Discharge status: Home / Self Care | Payer: Medicaid Other | Attending: Emergency Medicine | Admitting: Emergency Medicine

## 2019-04-05 ENCOUNTER — Emergency Department (HOSPITAL_COMMUNITY): Payer: Medicaid Other

## 2019-04-05 ENCOUNTER — Encounter (HOSPITAL_COMMUNITY): Payer: Self-pay | Admitting: Emergency Medicine

## 2019-04-05 DIAGNOSIS — D649 Anemia, unspecified: Secondary | ICD-10-CM

## 2019-04-05 DIAGNOSIS — J189 Pneumonia, unspecified organism: Secondary | ICD-10-CM

## 2019-04-05 DIAGNOSIS — Z20822 Contact with and (suspected) exposure to covid-19: Secondary | ICD-10-CM | POA: Insufficient documentation

## 2019-04-05 DIAGNOSIS — Z7982 Long term (current) use of aspirin: Secondary | ICD-10-CM | POA: Insufficient documentation

## 2019-04-05 DIAGNOSIS — F17210 Nicotine dependence, cigarettes, uncomplicated: Secondary | ICD-10-CM | POA: Insufficient documentation

## 2019-04-05 LAB — TYPE AND SCREEN
ABO/RH(D): B POS
Antibody Screen: NEGATIVE

## 2019-04-05 LAB — BASIC METABOLIC PANEL
Anion gap: 11 (ref 5–15)
BUN: 7 mg/dL (ref 6–20)
CO2: 24 mmol/L (ref 22–32)
Calcium: 8.3 mg/dL — ABNORMAL LOW (ref 8.9–10.3)
Chloride: 101 mmol/L (ref 98–111)
Creatinine, Ser: 0.86 mg/dL (ref 0.44–1.00)
GFR calc Af Amer: >60 mL/min (ref >60)
GFR calc non Af Amer: >60 mL/min (ref >60)
Glucose, Bld: 123 mg/dL — ABNORMAL HIGH (ref 70–99)
Potassium: 3.1 mmol/L — ABNORMAL LOW (ref 3.5–5.1)
Sodium: 136 mmol/L (ref 135–145)

## 2019-04-05 LAB — URINALYSIS, ROUTINE W REFLEX MICROSCOPIC
Bacteria, UA: NONE SEEN
Bilirubin Urine: NEGATIVE
Glucose, UA: NEGATIVE mg/dL
Ketones, ur: NEGATIVE mg/dL
Leukocytes,Ua: NEGATIVE
Nitrite: NEGATIVE
Protein, ur: NEGATIVE mg/dL
RBC / HPF: >50 RBC/hpf — ABNORMAL HIGH (ref 0–5)
Specific Gravity, Urine: 1.019 (ref 1.005–1.030)
pH: 5 (ref 5.0–8.0)

## 2019-04-05 LAB — CBC
HCT: 25 % — ABNORMAL LOW (ref 36.0–46.0)
Hemoglobin: 6.1 g/dL — CL (ref 12.0–15.0)
MCH: 15 pg — ABNORMAL LOW (ref 26.0–34.0)
MCHC: 24.4 g/dL — ABNORMAL LOW (ref 30.0–36.0)
MCV: 61.4 fL — ABNORMAL LOW (ref 80.0–100.0)
Platelets: 462 10*3/uL — ABNORMAL HIGH (ref 150–400)
RBC: 4.07 MIL/uL (ref 3.87–5.11)
RDW: 23.5 % — ABNORMAL HIGH (ref 11.5–15.5)
WBC: 14.3 10*3/uL — ABNORMAL HIGH (ref 4.0–10.5)
nRBC: 0 % (ref 0.0–0.2)

## 2019-04-05 LAB — I-STAT BETA HCG BLOOD, ED (MC, WL, AP ONLY): I-stat hCG, quantitative: <5 m[IU]/mL (ref <5)

## 2019-04-05 LAB — LACTIC ACID, PLASMA
Lactic Acid, Venous: 2.9 mmol/L (ref 0.5–1.9)
Lactic Acid, Venous: 2.2 mmol/L (ref 0.5–1.9)

## 2019-04-05 LAB — ABO/RH: ABO/RH(D): B POS

## 2019-04-05 LAB — POC SARS CORONAVIRUS 2 AG -  ED: SARS Coronavirus 2 Ag: NEGATIVE

## 2019-04-05 LAB — TROPONIN I (HIGH SENSITIVITY)
Troponin I (High Sensitivity): 9 ng/L (ref <18)
Troponin I (High Sensitivity): 8 ng/L (ref <18)

## 2019-04-05 MED ORDER — FERROUS SULFATE 325 (65 FE) MG PO TABS: 325.0000 mg | ORAL_TABLET | Freq: Every day | ORAL | 0 refills | Status: AC

## 2019-04-05 MED ORDER — AZITHROMYCIN 250 MG PO TABS: 250.0000 mg | ORAL_TABLET | Freq: Every day | ORAL | 0 refills | Status: DC

## 2019-04-05 MED ORDER — ALBUTEROL SULFATE HFA 108 (90 BASE) MCG/ACT IN AERS
2.0000 | INHALATION_SPRAY | Freq: Once | RESPIRATORY_TRACT | Status: AC
  Administered 2019-04-05: 2 via RESPIRATORY_TRACT
  Filled 2019-04-05: qty 6.7

## 2019-04-05 MED ORDER — AZITHROMYCIN 250 MG PO TABS
500.0000 mg | ORAL_TABLET | Freq: Once | ORAL | Status: AC
  Administered 2019-04-05: 04:00:00 500 mg via ORAL
  Filled 2019-04-05: qty 2

## 2019-04-05 MED ORDER — ACETAMINOPHEN 325 MG PO TABS
650.0000 mg | ORAL_TABLET | Freq: Once | ORAL | Status: AC
  Administered 2019-04-05: 01:00:00 650 mg via ORAL
  Filled 2019-04-05: qty 2

## 2019-04-05 MED ORDER — SODIUM CHLORIDE 0.9% FLUSH: 3.0000 mL | Freq: Once | INTRAVENOUS | Status: DC

## 2019-04-05 NOTE — ED Provider Notes (Addendum)
[facility] EMERGENCY DEPARTMENT Provider Note   CSN: [id number] Arrival date & time: 04/05/19  0003     History Chief Complaint  Patient presents with  . Cough    Melissa Soto is a 44 y.o. female.  Patient with history of anemia presents for evaluation of back pain, aches, cough, fever, SOB that started yesterday morning while getting of from work. She reports congested cough that continued throughout the day associated with SOB despite use of Theraflu. She reports that while waiting in the ED, around 10:30 pm symptoms of cough and SOB, aches/back pain resolved. She continues to feel significantly better than on arrival. No nausea, vomiting, urinary symptoms. She reports there have been positive COVID co-workers recently.   The history is provided by the patient. No language interpreter was used.  Cough Associated symptoms: fever, myalgias and shortness of breath   Associated symptoms: no headaches and no sore throat        Past Medical History:  Diagnosis Date  . Peritonsillar abscess     There are no problems to display for this patient.   Past Surgical History:  Procedure Laterality Date  . CESAREAN SECTION       OB History   No obstetric history on file.     Family History  Problem Relation Age of Onset  . Healthy Mother   . Hypertension Father     Social History   Tobacco Use  . Smoking status: Current Every Day Smoker    Packs/day: 0.50    Types: Cigarettes  . Smokeless tobacco: Never Used  Substance Use Topics  . Alcohol use: Yes    Comment: occasional  . Drug use: Not on file    Home Medications Prior to Admission medications   Medication Sig Start Date End Date Taking? Authorizing Provider  acetaminophen (TYLENOL) 650 MG CR tablet Take 1,300 mg by mouth daily as needed for pain.    [provider]  Aspirin-Caffeine 500-32.5 MG TABS Take 3 tablets by mouth 2 (two) times daily as needed (for pain).     [provider]  DULoxetine (CYMBALTA) 30 MG capsule Take 30 mg by mouth daily.    [provider]    Allergies    Patient has no known allergies.  Review of Systems   Review of Systems  Constitutional: Positive for fever.  HENT: Negative.  Negative for congestion and sore throat.   Respiratory: Positive for cough, chest tightness and shortness of breath.   Cardiovascular: Negative.   Gastrointestinal: Negative.  Negative for abdominal pain, nausea and vomiting.  Genitourinary: Negative.   Musculoskeletal: Positive for back pain and myalgias.  Skin: Negative.   Neurological: Negative.  Negative for weakness, light-headedness and headaches.    Physical Exam Updated Vital Signs BP 140/68   Pulse 89   Temp 99.3 F (37.4 C) (Oral)   Resp 20   Ht 5\' 10"  (1.778 m)   Wt 127 kg   LMP 04/05/2019   SpO2 100%   BMI 40.18 kg/m   Physical Exam Vitals and nursing note reviewed.  Constitutional:      Appearance: She is well-developed.  HENT:     Head: Normocephalic.  Cardiovascular:     Rate and Rhythm: Normal rate and regular rhythm.     Heart sounds: No murmur.  Pulmonary:     Effort: Pulmonary effort is normal.     Breath sounds: Rales present. No wheezing or rhonchi.     Comments:  Slightly decreased air movement in lower fields. Scattered rales. Abdominal:     General: Bowel sounds are normal.     Palpations: Abdomen is soft.     Tenderness: There is no abdominal tenderness. There is no guarding or rebound.  Musculoskeletal:        General: Normal range of motion.     Cervical back: Normal range of motion and neck supple.  Skin:    General: Skin is warm and dry.     Findings: No rash.  Neurological:     Mental Status: She is alert and oriented to person, place, and time.     ED Results / Procedures / Treatments   Labs (all labs ordered are listed, but only abnormal results are displayed) Labs Reviewed  BASIC METABOLIC PANEL - Abnormal; Notable  for the following components:      Result Value   Potassium 3.1 (*)    Glucose, Bld 123 (*)    Calcium 8.3 (*)    All other components within normal limits  CBC - Abnormal; Notable for the following components:   WBC 14.3 (*)    Hemoglobin 6.1 (*)    HCT 25.0 (*)    MCV 61.4 (*)    MCH 15.0 (*)    MCHC 24.4 (*)    RDW 23.5 (*)    Platelets 462 (*)    All other components within normal limits  LACTIC ACID, PLASMA - Abnormal; Notable for the following components:   Lactic Acid, Venous 2.9 (*)    All other components within normal limits  URINALYSIS, ROUTINE W REFLEX MICROSCOPIC - Abnormal; Notable for the following components:   APPearance HAZY (*)    Hgb urine dipstick MODERATE (*)    RBC / HPF >50 (*)    All other components within normal limits  CULTURE, BLOOD (ROUTINE X 2)  CULTURE, BLOOD (ROUTINE X 2)  LACTIC ACID, PLASMA  I-STAT BETA HCG BLOOD, ED ([facility], [facility], [facility] ONLY)  POC SARS CORONAVIRUS 2 AG -  ED  TYPE AND SCREEN  ABO/RH  TROPONIN I (HIGH SENSITIVITY)  TROPONIN I (HIGH SENSITIVITY)    EKG None  Radiology DG Chest Portable 1 View  Result Date: 04/05/2019 CLINICAL DATA:  Cough. Chest tightness. Fever. Shortness of breath. EXAM: PORTABLE CHEST 1 VIEW COMPARISON:  None. FINDINGS: Normal heart size and mediastinal contours. Patchy opacities in both lower lung zones and left mid lung. No large pleural effusion. No pneumothorax. No pulmonary edema. No acute osseous abnormalities are seen. IMPRESSION: Patchy opacities in both lower lung zones and left mid lung, may represent atelectasis or pneumonia, including atypical viral infection (COVID-19). Electronically Signed   By: Narda Rutherford M.D.   On: 04/05/2019 00:58    Procedures Procedures (including critical care time) CRITICAL CARE Performed by: Arnoldo Hooker   Total critical care time: 55 minutes  Critical care time was exclusive of separately billable procedures and treating other patients.  Critical care  was necessary to treat or prevent imminent or life-threatening deterioration.  Critical care was time spent personally by me on the following activities: development of treatment plan with patient and/or surrogate as well as nursing, discussions with consultants, evaluation of patient's response to treatment, examination of patient, obtaining history from patient or surrogate, ordering and performing treatments and interventions, ordering and review of laboratory studies, ordering and review of radiographic studies, pulse oximetry and re-evaluation of patient's condition. Medications Ordered in ED Medications  sodium chloride flush (NS) 0.9 % injection  3 mL (3 mLs Intravenous Not Given 04/05/19 0133)  acetaminophen (TYLENOL) tablet 650 mg (650 mg Oral Given 04/05/19 0048)    ED Course  I have reviewed the triage vital signs and the nursing notes.  Pertinent labs & imaging results that were available during my care of the patient were reviewed by me and considered in my medical decision making (see chart for details).    MDM Rules/Calculators/A&P                      Patient to ED with symptoms of aches and back pain, SOB, chest congestion, cough and fever x 1 day. Feeling much better currently.   She is overall well appearing and in NAD. No respiratory distress or labored breathing.   Hgb 6.1. She reports history of anemia with baseline of "around 7". She has not had transfusions in the past but they have been recommended in the past. She denies lightheadedness. She is currently menstruating and reports h/o heavy menstruation. When asked specifically about transfusion she reports she will decline due to being a Jehovah's Witness. She denies lightheadedness on orthostatic VS.   She has ss/sxs c/w COVID-19, as well as exposures. Rapid COVID negative but strongly suspect she is COVID positive. She also has a leukocytosis and an elevated lactic acid which are felt inconsistent with viral infection.  Will start on Zithromax to cover for concomitant PNA.   Albuterol inhaler provided for any recurrent SOB. The patient is felt appropriate for discharge home. Counseled on importance of GYN follow up to discuss control of heavy menstrual bleeding in the setting of anemia. Return precautions discussed.   She will plan to quarantine at home for 10 days per [facility] recommendations. However, if her send-out COVID is negative, she can return to work after treatment for bacterial pneumonia. Return precautions discussed regarding SOB, lightheadedness.   Final Clinical Impression(s) / ED Diagnoses Final diagnoses:  None   1. Pneumonia 2. COVID exposures 3. Severe anemia, refusing transfusion   Rx / DC Orders ED Discharge Orders    None       Charlann Lange, PA-C 04/05/19 Hawkins, [place], PA-C 04/05/19 Stanly, Delice Bison, DO 04/05/19 [phone]

## 2019-04-05 NOTE — ED Notes (Signed)
Discharge instructions discussed with pt. Pt verbalized understanding with no questions at this time. Instructions in regards to COVID quarantine discussed with pt.

## 2019-04-05 NOTE — ED Notes (Signed)
Orthostatic VS obtained at this time. Pt confirms dizziness when changing from a lying positions to a sitting position. Pt denies any other complaints.

## 2019-04-05 NOTE — ED Triage Notes (Signed)
Pt reports cough, chest tightness, fever, back pain and significant shob onset this am before going to work. Pt reports exposure to sickness at work.

## 2019-04-05 NOTE — ED Notes (Signed)
Ambulated pt in hall with SpO2. Pt O2 stats maintained above 98% on room air.

## 2019-04-05 NOTE — Discharge Instructions (Addendum)
Use the inhaler for any symptoms of shortness of breath or chest tightness. If you continue to have symptoms or if these symptoms become worse, please return to the emergency department immediately.   Please follow up with OB/GYN for treatment of heavy periods in the setting of anemia. Take iron tablets daily. Please return to the emergency department with any lightheadedness, passing out, severe fatigue or new concern.  You will need to stay out of work due to suspected COVID-19 infection for 10 days, as long as there is no fever in the last 3 days of the 10.

## 2019-04-06 LAB — CULTURE, BLOOD (ROUTINE X 2): Special Requests: ADEQUATE

## 2019-04-07 ENCOUNTER — Telehealth (HOSPITAL_COMMUNITY): Payer: Self-pay

## 2019-04-07 NOTE — Telephone Encounter (Signed)
Pt. Called and reported that she never received an RX for her Antibiotic from her discharge in 04/05/1999. Informed pt. That RX will be called into the pharmacy, [facility] on [address] [phone], Azithromycin 250mg  tablets, take 1 tab po for 4 days.  Dispense:  4 tablets, 0 refills,  Shari Upstill, PA-C, Pt. Verbalized understanding and no further questions.

## 2019-04-10 LAB — CULTURE, BLOOD (ROUTINE X 2): Culture: NO GROWTH

## 2019-04-11 ENCOUNTER — Emergency Department (HOSPITAL_COMMUNITY)
Admission: EM | Admit: 2019-04-11 | Discharge: 2019-04-11 | Disposition: A | Discharge status: Home / Self Care | Payer: Medicaid Other | Attending: Emergency Medicine | Admitting: Emergency Medicine

## 2019-04-11 ENCOUNTER — Encounter (HOSPITAL_COMMUNITY): Payer: Self-pay | Admitting: Emergency Medicine

## 2019-04-11 ENCOUNTER — Emergency Department (HOSPITAL_COMMUNITY): Payer: Medicaid Other

## 2019-04-11 ENCOUNTER — Other Ambulatory Visit: Payer: Self-pay

## 2019-04-11 DIAGNOSIS — Z9289 Personal history of other medical treatment: Secondary | ICD-10-CM | POA: Insufficient documentation

## 2019-04-11 DIAGNOSIS — Z79899 Other long term (current) drug therapy: Secondary | ICD-10-CM | POA: Insufficient documentation

## 2019-04-11 DIAGNOSIS — F17210 Nicotine dependence, cigarettes, uncomplicated: Secondary | ICD-10-CM | POA: Insufficient documentation

## 2019-04-11 LAB — CBC WITH DIFFERENTIAL/PLATELET
Abs Immature Granulocytes: 0.24 10*3/uL — ABNORMAL HIGH (ref 0.00–0.07)
Basophils Absolute: 0 10*3/uL (ref 0.0–0.1)
Basophils Relative: 0 %
Eosinophils Absolute: 0.1 10*3/uL (ref 0.0–0.5)
Eosinophils Relative: 1 %
HCT: 27.6 % — ABNORMAL LOW (ref 36.0–46.0)
Hemoglobin: 6.7 g/dL — CL (ref 12.0–15.0)
Immature Granulocytes: 3 %
Lymphocytes Relative: 35 %
Lymphs Abs: 2.8 10*3/uL (ref 0.7–4.0)
MCH: 15.2 pg — ABNORMAL LOW (ref 26.0–34.0)
MCHC: 24.3 g/dL — ABNORMAL LOW (ref 30.0–36.0)
MCV: 62.7 fL — ABNORMAL LOW (ref 80.0–100.0)
Monocytes Absolute: 0.5 10*3/uL (ref 0.1–1.0)
Monocytes Relative: 7 %
Neutro Abs: 4.3 10*3/uL (ref 1.7–7.7)
Neutrophils Relative %: 54 %
Platelets: 647 10*3/uL — ABNORMAL HIGH (ref 150–400)
RBC: 4.4 MIL/uL (ref 3.87–5.11)
RDW: 24.1 % — ABNORMAL HIGH (ref 11.5–15.5)
WBC: 7.9 10*3/uL (ref 4.0–10.5)
nRBC: 0.9 % — ABNORMAL HIGH (ref 0.0–0.2)

## 2019-04-11 LAB — COMPREHENSIVE METABOLIC PANEL
ALT: 12 U/L (ref 0–44)
AST: 16 U/L (ref 15–41)
Albumin: 3.4 g/dL — ABNORMAL LOW (ref 3.5–5.0)
Alkaline Phosphatase: 63 U/L (ref 38–126)
Anion gap: 11 (ref 5–15)
BUN: 11 mg/dL (ref 6–20)
CO2: 25 mmol/L (ref 22–32)
Calcium: 8.8 mg/dL — ABNORMAL LOW (ref 8.9–10.3)
Chloride: 104 mmol/L (ref 98–111)
Creatinine, Ser: 0.77 mg/dL (ref 0.44–1.00)
GFR calc Af Amer: >60 mL/min (ref >60)
GFR calc non Af Amer: >60 mL/min (ref >60)
Glucose, Bld: 105 mg/dL — ABNORMAL HIGH (ref 70–99)
Potassium: 3.4 mmol/L — ABNORMAL LOW (ref 3.5–5.1)
Sodium: 140 mmol/L (ref 135–145)
Total Bilirubin: 0.2 mg/dL — ABNORMAL LOW (ref 0.3–1.2)
Total Protein: 8.4 g/dL — ABNORMAL HIGH (ref 6.5–8.1)

## 2019-04-11 NOTE — ED Triage Notes (Signed)
Patient returns to ED after being called with positive Blood cultures.  She was recommended to come back to ED and be evaluated.  Patient was given antibiotics on 3/29 and she has finished her course of antibiotics.  Patient does have low grade fever, she has been Covid negative.  Patient states she still has a cough, with mild shortness of breath.

## 2019-04-11 NOTE — ED Notes (Signed)
Hemoglobin of 6.8

## 2019-04-11 NOTE — ED Notes (Signed)
Pt verbalizes understanding of d/c instructions. Pt ambulatory at d/c with all belongings.   

## 2019-04-11 NOTE — ED Provider Notes (Signed)
[facility] EMERGENCY DEPARTMENT Provider Note   CSN: [id number] Arrival date & time: 04/11/19  1914     History Chief Complaint  Patient presents with  . Abnormal Lab    Melissa Soto is a 44 y.o. female.  HPI Patient is a 44 year old female with no significant past medical history presenting to the ED today because she was told that she had a positive blood culture.  Patient initially visited the ED on 3/27 and was treated with a Z-Pak for community-acquired pneumonia.  Blood cultures were collected at that time.  On 3/29, patient was notified of positive blood culture and told to come back to the ED for reevaluation.  Patient says that she was initially experiencing cough, shortness of breath and dizziness.  She says that all of the symptoms have improved and she has noticed decreased sputum production.  She denies fever, chills, nausea, vomiting, diarrhea, dysuria, headache.    Past Medical History:  Diagnosis Date  . Peritonsillar abscess     There are no problems to display for this patient.   Past Surgical History:  Procedure Laterality Date  . CESAREAN SECTION       OB History   No obstetric history on file.     Family History  Problem Relation Age of Onset  . Healthy Mother   . Hypertension Father     Social History   Tobacco Use  . Smoking status: Current Every Day Smoker    Packs/day: 0.50    Types: Cigarettes  . Smokeless tobacco: Never Used  Substance Use Topics  . Alcohol use: Yes    Comment: occasional  . Drug use: Not on file    Home Medications Prior to Admission medications   Medication Sig Start Date End Date Taking? Authorizing Provider  acetaminophen (TYLENOL) 650 MG CR tablet Take 1,300 mg by mouth daily as needed for pain.    [provider]  Aspirin-Caffeine 500-32.5 MG TABS Take 3 tablets by mouth 2 (two) times daily as needed (for pain).    [provider]  azithromycin (ZITHROMAX Z-PAK)  250 MG tablet Take 1 tablet (250 mg total) by mouth daily. 04/05/19   Elpidio Anis, PA-C  DULoxetine (CYMBALTA) 30 MG capsule Take 30 mg by mouth daily.    [provider]  ferrous sulfate 325 (65 FE) MG tablet Take 1 tablet (325 mg total) by mouth daily. 04/05/19   Elpidio Anis, PA-C    Allergies    Patient has no known allergies.  Review of Systems   Review of Systems  Constitutional: Negative for chills and fever.  HENT: Negative for rhinorrhea and sore throat.   Eyes: Negative for pain and visual disturbance.  Respiratory: Positive for cough. Negative for shortness of breath.   Cardiovascular: Negative for chest pain and palpitations.  Gastrointestinal: Negative for abdominal pain, diarrhea, nausea and vomiting.  Genitourinary: Negative for dysuria and hematuria.  Musculoskeletal: Negative for arthralgias and back pain.  Skin: Negative for color change and rash.  Neurological: Negative for seizures, syncope, weakness and headaches.  Psychiatric/Behavioral: Negative for agitation.  All other systems reviewed and are negative.   Physical Exam Updated Vital Signs BP (!) 159/82 (BP Location: Left Arm)   Pulse 82   Temp 99 F (37.2 C) (Oral)   Resp 16   LMP 04/05/2019   SpO2 100%   Physical Exam Vitals and nursing note reviewed.  Constitutional:      General: She is not in acute  distress.    Appearance: Normal appearance. She is well-developed. She is not ill-appearing.  HENT:     Head: Normocephalic and atraumatic.     Right Ear: External ear normal.     Left Ear: External ear normal.     Nose: Nose normal. No congestion or rhinorrhea.     Mouth/Throat:     Mouth: Mucous membranes are moist.     Pharynx: Oropharynx is clear.  Eyes:     Extraocular Movements: Extraocular movements intact.     Pupils: Pupils are equal, round, and reactive to light.  Cardiovascular:     Rate and Rhythm: Normal rate and regular rhythm.     Pulses: Normal pulses.     Heart  sounds: Murmur (systolic) present.  Pulmonary:     Effort: Pulmonary effort is normal. No respiratory distress.     Breath sounds: Normal breath sounds. No stridor. No wheezing, rhonchi or rales.  Abdominal:     General: There is no distension.     Palpations: Abdomen is soft.     Tenderness: There is no abdominal tenderness.  Musculoskeletal:        General: Normal range of motion.     Cervical back: Normal range of motion and neck supple.     Right lower leg: No edema.     Left lower leg: No edema.  Skin:    General: Skin is warm and dry.     Capillary Refill: Capillary refill takes less than 2 seconds.  Neurological:     General: No focal deficit present.     Mental Status: She is alert and oriented to person, place, and time. Mental status is at baseline.  Psychiatric:        Mood and Affect: Mood normal.     ED Results / Procedures / Treatments   Labs (all labs ordered are listed, but only abnormal results are displayed) Labs Reviewed  CBC WITH DIFFERENTIAL/PLATELET - Abnormal; Notable for the following components:      Result Value   Hemoglobin 6.7 (*)    HCT 27.6 (*)    MCV 62.7 (*)    MCH 15.2 (*)    MCHC 24.3 (*)    RDW 24.1 (*)    Platelets 647 (*)    nRBC 0.9 (*)    Abs Immature Granulocytes 0.24 (*)    All other components within normal limits  COMPREHENSIVE METABOLIC PANEL - Abnormal; Notable for the following components:   Potassium 3.4 (*)    Glucose, Bld 105 (*)    Calcium 8.8 (*)    Total Protein 8.4 (*)    Albumin 3.4 (*)    Total Bilirubin 0.2 (*)    All other components within normal limits  CULTURE, BLOOD (ROUTINE X 2)  CULTURE, BLOOD (ROUTINE X 2)    EKG EKG Interpretation  Date/Time:  Friday April 11 2019 19:36:06 EDT Ventricular Rate:  81 PR Interval:  166 QRS Duration: 86 QT Interval:  394 QTC Calculation: 457 R Axis:   1 Text Interpretation: Normal sinus rhythm Nonspecific ST and T wave abnormality Abnormal ECG Confirmed by Tilden Fossa [phone]) on 04/11/2019 8:52:00 PM   Radiology DG Chest 2 View  Result Date: 04/11/2019 CLINICAL DATA:  Cough and short of breath EXAM: CHEST - 2 VIEW COMPARISON:  04/05/2019 FINDINGS: Improved aeration bilaterally. No focal consolidation or pleural effusion. Mild cardiomegaly. No pneumothorax. IMPRESSION: Improved aeration bilaterally without consolidative pneumonia. Mild cardiomegaly Electronically Signed   By: Selena Batten  Francoise Ceo M.D.   On: 04/11/2019 20:00    Procedures Procedures (including critical care time)  Medications Ordered in ED Medications - No data to display  ED Course  I have reviewed the triage vital signs and the nursing notes.  Pertinent labs & imaging results that were available during my care of the patient were reviewed by me and considered in my medical decision making (see chart for details).    MDM Rules/Calculators/A&P                     Patient is a 44 year old female with recent CAP presenting to the ED today because she was told that she had a positive blood culture.  On exam, patient has a systolic murmur on auscultation..  Vital signs stable.  Afebrile.  On arrival, patient appears generally well and is displaying no signs of acute distress.  Patient says that she was called 4 days ago and told that she had a positive blood culture and that she should return to the ED for evaluation.  Patient says that all of her symptoms from her community-acquired pneumonia have begun to improve and she completed her Z-Pak yesterday.  On chart review, 1 out of 2 bottles grew coagulase-negative staph, which was likely a contaminant.  Repeat blood cultures collected.  CBC shows hemoglobin 6.7 which is increased from 6.1 six days ago.  Patient reports a history of anemia and says that her baseline is around 7.  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  She takes iron supplements at home.  CMP with improved potassium and calcium.  Patient has no knowledge  of previous heart murmur and we do not see any documentation of this in her chart.  Discussed patient's case with ID physician on call who agrees that positive blood culture is likely a contaminant and recommends treating the murmur as if the blood cultures have been negative.  No further work-up or intervention provided while in the ED.  At this point, patient appears stable for discharge.  Educated patient that her previous blood culture was likely contaminated.  Instructed her that if her repeat set of blood cultures were positive, that she would be called back.  Patient says that she feels safe to be discharged home.  Encouraged her to follow-up with her PCP in 2 to 3 days for potential further work-up for heart murmur.  Provided strict return precautions.  Patient assessed and evaluated with Dr. Ralene Bathe.  Nadeen Landau, MD   Final Clinical Impression(s) / ED Diagnoses Final diagnoses:  H/O blood culture    Rx / DC Orders ED Discharge Orders    None       Nadeen Landau, MD 04/12/19 Zackery Barefoot, MD 04/13/19 1155

## 2019-04-16 LAB — CULTURE, BLOOD (ROUTINE X 2)
Culture: NO GROWTH
Culture: NO GROWTH
Special Requests: ADEQUATE
Special Requests: ADEQUATE

## 2020-01-07 ENCOUNTER — Encounter (HOSPITAL_COMMUNITY): Payer: Self-pay | Admitting: *Deleted

## 2020-01-07 ENCOUNTER — Ambulatory Visit (HOSPITAL_COMMUNITY)
Admission: EM | Admit: 2020-01-07 | Discharge: 2020-01-07 | Disposition: A | Discharge status: Home / Self Care | Payer: HRSA Program | Attending: Urgent Care | Admitting: Urgent Care

## 2020-01-07 ENCOUNTER — Other Ambulatory Visit: Payer: Self-pay

## 2020-01-07 DIAGNOSIS — J069 Acute upper respiratory infection, unspecified: Secondary | ICD-10-CM | POA: Diagnosis not present

## 2020-01-07 DIAGNOSIS — Z20822 Contact with and (suspected) exposure to covid-19: Secondary | ICD-10-CM | POA: Diagnosis not present

## 2020-01-07 DIAGNOSIS — F17200 Nicotine dependence, unspecified, uncomplicated: Secondary | ICD-10-CM

## 2020-01-07 DIAGNOSIS — F17210 Nicotine dependence, cigarettes, uncomplicated: Secondary | ICD-10-CM | POA: Insufficient documentation

## 2020-01-07 DIAGNOSIS — R490 Dysphonia: Secondary | ICD-10-CM | POA: Diagnosis not present

## 2020-01-07 DIAGNOSIS — R059 Cough, unspecified: Secondary | ICD-10-CM | POA: Diagnosis present

## 2020-01-07 MED ORDER — BENZONATATE 100 MG PO CAPS: 100.0000 mg | ORAL_CAPSULE | Freq: Three times a day (TID) | ORAL | 0 refills | Status: DC | PRN

## 2020-01-07 MED ORDER — PROMETHAZINE-DM 6.25-15 MG/5ML PO SYRP: 5.0000 mL | ORAL_SOLUTION | Freq: Every evening | ORAL | 0 refills | Status: DC | PRN

## 2020-01-07 MED ORDER — PREDNISONE 20 MG PO TABS: ORAL_TABLET | ORAL | 0 refills | Status: DC

## 2020-01-07 NOTE — ED Provider Notes (Signed)
°  [facility]   MRN: [id number] DOB: [birth year]  Subjective:   Melissa Soto is a 44 y.o. female presenting for 8-day history of persistent dry hacking cough that is now eliciting sore throat and hoarseness.  Initially patient had a difficult time with body aches but that improved after about a day.  Unfortunately, her cough has persisted and is getting worse.  She is a smoker.  Denies history of lung disorders.  Patient is COVID vaccinated.   Denies taking chronic medications.  No Known Allergies  Past Medical History:  Diagnosis Date   Peritonsillar abscess      Past Surgical History:  Procedure Laterality Date   CESAREAN SECTION      Family History  Problem Relation Age of Onset   Healthy Mother    Hypertension Father     Social History   Tobacco Use   Smoking status: Current Every Day Smoker    Packs/day: 0.50    Types: Cigarettes   Smokeless tobacco: Never Used  Vaping Use   Vaping Use: Never used  Substance Use Topics   Alcohol use: Yes    Comment: occasional    ROS   Objective:   Vitals: BP (!) 159/97 (BP Location: Left Arm)    Pulse 70    Temp 98.8 F (37.1 C)    Resp 18    LMP 12/23/2019    SpO2 99%   Physical Exam Constitutional:      General: She is not in acute distress.    Appearance: Normal appearance. She is well-developed. She is obese. She is not ill-appearing, toxic-appearing or diaphoretic.  HENT:     Head: Normocephalic and atraumatic.     Nose: Nose normal.     Mouth/Throat:     Mouth: Mucous membranes are moist.     Comments: Hoarseness of her voice. Eyes:     Extraocular Movements: Extraocular movements intact.     Pupils: Pupils are equal, round, and reactive to light.  Cardiovascular:     Rate and Rhythm: Normal rate and regular rhythm.     Pulses: Normal pulses.     Heart sounds: Normal heart sounds. No murmur heard. No friction rub. No gallop.   Pulmonary:     Effort: Pulmonary effort is  normal. No respiratory distress.     Breath sounds: Normal breath sounds. No stridor. No wheezing, rhonchi or rales.  Skin:    General: Skin is warm and dry.     Findings: No rash.  Neurological:     Mental Status: She is alert and oriented to person, place, and time.  Psychiatric:        Mood and Affect: Mood normal.        Behavior: Behavior normal.        Thought Content: Thought content normal.     Assessment and Plan :   PDMP not reviewed this encounter.  1. Viral URI with cough   2. Hoarse   3. Smoker     Suspect the patient started out with a viral illness and has perpetuated a cough due to her smoking.  COVID-19 testing pending.  Recommended prednisone course, cough suppression medications.  Use supportive care otherwise. Counseled patient on potential for adverse effects with medications prescribed/recommended today, ER and return-to-clinic precautions discussed, patient verbalized understanding.    Wallis Bamberg, [place] 01/07/20 2042

## 2020-01-07 NOTE — ED Triage Notes (Signed)
Pt reports a week ago having  A fever ,cough and may bronchitis .

## 2020-01-08 LAB — SARS CORONAVIRUS 2 (TAT 6-24 HRS): SARS Coronavirus 2: NEGATIVE

## 2020-01-20 ENCOUNTER — Encounter (HOSPITAL_COMMUNITY): Payer: Self-pay | Admitting: Emergency Medicine

## 2020-01-20 ENCOUNTER — Ambulatory Visit (HOSPITAL_COMMUNITY)
Admission: EM | Admit: 2020-01-20 | Discharge: 2020-01-20 | Disposition: A | Discharge status: Home / Self Care | Payer: HRSA Program | Attending: Emergency Medicine | Admitting: Emergency Medicine

## 2020-01-20 ENCOUNTER — Other Ambulatory Visit: Payer: Self-pay

## 2020-01-20 DIAGNOSIS — R059 Cough, unspecified: Secondary | ICD-10-CM | POA: Insufficient documentation

## 2020-01-20 DIAGNOSIS — R490 Dysphonia: Secondary | ICD-10-CM | POA: Diagnosis not present

## 2020-01-20 DIAGNOSIS — J209 Acute bronchitis, unspecified: Secondary | ICD-10-CM

## 2020-01-20 DIAGNOSIS — U071 COVID-19: Secondary | ICD-10-CM | POA: Diagnosis present

## 2020-01-20 DIAGNOSIS — Z87891 Personal history of nicotine dependence: Secondary | ICD-10-CM | POA: Diagnosis not present

## 2020-01-20 DIAGNOSIS — Z7952 Long term (current) use of systemic steroids: Secondary | ICD-10-CM | POA: Insufficient documentation

## 2020-01-20 DIAGNOSIS — Z79899 Other long term (current) drug therapy: Secondary | ICD-10-CM | POA: Diagnosis not present

## 2020-01-20 MED ORDER — AZITHROMYCIN 250 MG PO TABS: 250.0000 mg | ORAL_TABLET | Freq: Every day | ORAL | 0 refills | Status: DC

## 2020-01-20 MED ORDER — PREDNISONE 10 MG PO TABS: 40.0000 mg | ORAL_TABLET | Freq: Every day | ORAL | 0 refills | Status: AC

## 2020-01-20 NOTE — ED Provider Notes (Signed)
[facility]    CSN: [id number] Arrival date & time: 01/20/20  1840      History   Chief Complaint Chief Complaint  Patient presents with  . Cough    HPI Melissa Soto is a 45 y.o. female.   Patient presents with 2-week history of cough, shortness of breath, chest tightness with cough.  She denies fever, chills, diarrhea, or other symptoms.  Patient was seen here on 01/07/2020; diagnosed with viral URI with cough, hoarse, smoker; treated with prednisone and Tessalon Perles; COVID negative.  She denies pertinent medical history.   The history is provided by the patient.    Past Medical History:  Diagnosis Date  . Peritonsillar abscess     There are no problems to display for this patient.   Past Surgical History:  Procedure Laterality Date  . CESAREAN SECTION      OB History   No obstetric history on file.      Home Medications    Prior to Admission medications   Medication Sig Start Date End Date Taking? Authorizing Provider  azithromycin (ZITHROMAX) 250 MG tablet Take 1 tablet (250 mg total) by mouth daily. Take first 2 tablets together, then 1 every day until finished. 01/20/20  Yes Mickie Bail, NP  predniSONE (DELTASONE) 10 MG tablet Take 4 tablets (40 mg total) by mouth daily for 5 days. 01/20/20 01/25/20 Yes Mickie Bail, NP  benzonatate (TESSALON) 100 MG capsule Take 1-2 capsules (100-200 mg total) by mouth 3 (three) times daily as needed. 01/07/20   Wallis Bamberg, PA-C  ferrous sulfate 325 (65 FE) MG tablet Take 1 tablet (325 mg total) by mouth daily. 04/05/19   Elpidio Anis, PA-C  promethazine-dextromethorphan (PROMETHAZINE-DM) 6.25-15 MG/5ML syrup Take 5 mLs by mouth at bedtime as needed for cough. 01/07/20   Wallis Bamberg, PA-C  Pseudoephedrine-APAP-DM (DAYQUIL PO) Take 2 capsules by mouth daily as needed (cold symptoms).    [provider]    Family History Family History  Problem Relation Age of Onset  . Healthy Mother   .  Hypertension Father     Social History Social History   Tobacco Use  . Smoking status: Former Smoker    Packs/day: 0.50  . Smokeless tobacco: Never Used  Vaping Use  . Vaping Use: Never used  Substance Use Topics  . Alcohol use: Yes    Comment: occasional  . Drug use: Yes    Types: Marijuana     Allergies   Patient has no known allergies.   Review of Systems Review of Systems  Constitutional: Negative for chills and fever.  HENT: Negative for ear pain and sore throat.   Eyes: Negative for pain and visual disturbance.  Respiratory: Positive for cough, chest tightness and shortness of breath.   Cardiovascular: Negative for chest pain and palpitations.  Gastrointestinal: Negative for abdominal pain, diarrhea and vomiting.  Genitourinary: Negative for dysuria and hematuria.  Musculoskeletal: Negative for arthralgias and back pain.  Skin: Negative for color change and rash.  Neurological: Negative for seizures and syncope.  All other systems reviewed and are negative.    Physical Exam Triage Vital Signs ED Triage Vitals  Enc Vitals Group     BP      Pulse      Resp      Temp      Temp src      SpO2      Weight      Height  Head Circumference      Peak Flow      Pain Score      Pain Loc      Pain Edu?      Excl. in GC?    No data found.  Updated Vital Signs BP (!) 159/88 (BP Location: Right Arm)   Pulse 88   Temp 99.7 F (37.6 C) (Oral)   Resp 16   LMP 12/23/2019   SpO2 99%   Visual Acuity Right Eye Distance:   Left Eye Distance:   Bilateral Distance:    Right Eye Near:   Left Eye Near:    Bilateral Near:     Physical Exam Vitals and nursing note reviewed.  Constitutional:      General: She is not in acute distress.    Appearance: She is well-developed and well-nourished. She is obese.  HENT:     Head: Normocephalic and atraumatic.     Right Ear: Tympanic membrane normal.     Left Ear: Tympanic membrane normal.     Nose: Nose  normal.     Mouth/Throat:     Mouth: Mucous membranes are moist.     Pharynx: Oropharynx is clear.  Eyes:     Conjunctiva/sclera: Conjunctivae normal.  Cardiovascular:     Rate and Rhythm: Normal rate and regular rhythm.     Heart sounds: No murmur heard.   Pulmonary:     Effort: Pulmonary effort is normal. No respiratory distress.     Breath sounds: Normal breath sounds.     Comments: Diminished throughout.   Abdominal:     Palpations: Abdomen is soft.     Tenderness: There is no abdominal tenderness.  Musculoskeletal:        General: No edema.     Cervical back: Neck supple.  Skin:    General: Skin is warm and dry.  Neurological:     General: No focal deficit present.     Mental Status: She is alert and oriented to person, place, and time.  Psychiatric:        Mood and Affect: Mood and affect and mood normal.        Behavior: Behavior normal.      UC Treatments / Results  Labs (all labs ordered are listed, but only abnormal results are displayed) Labs Reviewed  SARS CORONAVIRUS 2 (TAT 6-24 HRS)    EKG   Radiology No results found.  Procedures Procedures (including critical care time)  Medications Ordered in UC Medications - No data to display  Initial Impression / Assessment and Plan / UC Course  I have reviewed the triage vital signs and the nursing notes.  Pertinent labs & imaging results that were available during my care of the patient were reviewed by me and considered in my medical decision making (see chart for details).   Acute bronchitis, cough.  Treating with prednisone, Zithromax, albuterol inhaler.  COVID pending.  Instructed patient to self quarantine until the test results are back.  Instructed patient to follow up with PCP in one week; sooner if not improving.  Patient agrees to plan of care.    Final Clinical Impressions(s) / UC Diagnoses   Final diagnoses:  Acute bronchitis, unspecified organism  Cough     Discharge Instructions      Take the prednisone and Zithromax as directed.  Use your albuterol inhaler as directed.    Your COVID test is pending.  You should self quarantine until the test result is back.  Schedule a follow-up appointment with your primary care provider in 1 week.          ED Prescriptions    Medication Sig Dispense Auth. Provider   predniSONE (DELTASONE) 10 MG tablet Take 4 tablets (40 mg total) by mouth daily for 5 days. 20 tablet Mickie Bail, NP   azithromycin (ZITHROMAX) 250 MG tablet Take 1 tablet (250 mg total) by mouth daily. Take first 2 tablets together, then 1 every day until finished. 6 tablet Mickie Bail, NP     PDMP not reviewed this encounter.   Mickie Bail, NP 01/20/20 1932

## 2020-01-20 NOTE — ED Triage Notes (Signed)
Here for persistent prod cough... she was last seen here on 12/29 and was given Prednisone pack w/no relief  Also reports chest pain d/t cough.   A&O x4... NAD.Marland Kitchen. ambulatory

## 2020-01-20 NOTE — Discharge Instructions (Signed)
Take the prednisone and Zithromax as directed.  Use your albuterol inhaler as directed.    Your COVID test is pending.  You should self quarantine until the test result is back.    Schedule a follow-up appointment with your primary care provider in 1 week.

## 2020-01-21 LAB — SARS CORONAVIRUS 2 (TAT 6-24 HRS): SARS Coronavirus 2: POSITIVE — AB

## 2020-01-22 ENCOUNTER — Telehealth: Payer: Self-pay | Admitting: *Deleted

## 2020-01-22 NOTE — Telephone Encounter (Signed)
Called to discuss with patient about COVID-19 symptoms and the use of one of the available treatments for those with mild to moderate Covid symptoms and at a high risk of hospitalization.  Pt appears to qualify for outpatient treatment due to co-morbid conditions and/or a member of an at-risk group in accordance with the FDA Emergency Use Authorization.    Symptom onset: Vaccinated:Yes per chart Booster? Immunocompromised?  Qualifiers:  Unable to reach pt - Left VM to return call.  Karsten Fells

## 2021-01-11 ENCOUNTER — Encounter (HOSPITAL_COMMUNITY): Payer: Self-pay

## 2021-01-11 ENCOUNTER — Ambulatory Visit (HOSPITAL_COMMUNITY)
Admission: EM | Admit: 2021-01-11 | Discharge: 2021-01-11 | Disposition: A | Discharge status: Home / Self Care | Payer: Medicaid Other | Attending: Emergency Medicine | Admitting: Emergency Medicine

## 2021-01-11 ENCOUNTER — Other Ambulatory Visit: Payer: Self-pay

## 2021-01-11 DIAGNOSIS — R051 Acute cough: Secondary | ICD-10-CM

## 2021-01-11 DIAGNOSIS — U071 COVID-19: Secondary | ICD-10-CM

## 2021-01-11 MED ORDER — PREDNISONE 20 MG PO TABS: 40.0000 mg | ORAL_TABLET | Freq: Every day | ORAL | 0 refills | Status: AC

## 2021-01-11 MED ORDER — ALBUTEROL SULFATE HFA 108 (90 BASE) MCG/ACT IN AERS: 1.0000 | INHALATION_SPRAY | RESPIRATORY_TRACT | 0 refills | Status: AC | PRN

## 2021-01-11 MED ORDER — AEROCHAMBER PLUS MISC: 2 refills | Status: AC

## 2021-01-11 MED ORDER — PROMETHAZINE-DM 6.25-15 MG/5ML PO SYRP: 5.0000 mL | ORAL_SOLUTION | Freq: Four times a day (QID) | ORAL | 0 refills | Status: AC | PRN

## 2021-01-11 NOTE — ED Provider Notes (Signed)
HPI  SUBJECTIVE:  Melissa Soto is a 46 y.o. female who presents clearance to return to work.  She tested positive for COVID 1 week ago.  She states her symptoms started a few days ago with fevers, chills, headache, cough, body aches and nasal congestion, rhinorrhea, nausea.  She states that she started feeling better 5 days ago.  She has been afebrile for more than 24 hours.  She has not required any antipyretics during this time.  She states that she has a lingering cough.  She needs a note to go back to work.  Policy is that she can return to work 5 days after a positive test and must wear a mask 5 days after, but needs a doctor's note stating this.  This is the third time she has had COVID.  She also has a history of asthma.  LMP: 12/24.  Denies possibility being pregnant.  PMD: [facility].  Past Medical History:  Diagnosis Date   Peritonsillar abscess     Past Surgical History:  Procedure Laterality Date   CESAREAN SECTION      Family History  Problem Relation Age of Onset   Healthy Mother    Hypertension Father     Social History   Tobacco Use   Smoking status: Former    Packs/day: 0.50    Types: Cigarettes   Smokeless tobacco: Never  Vaping Use   Vaping Use: Never used  Substance Use Topics   Alcohol use: Yes    Comment: occasional   Drug use: Yes    Types: Marijuana    No current facility-administered medications for this encounter.  Current Outpatient Medications:    albuterol (VENTOLIN HFA) 108 (90 Base) MCG/ACT inhaler, Inhale 1-2 puffs into the lungs every 4 (four) hours as needed for wheezing or shortness of breath., Disp: 1 each, Rfl: 0   predniSONE (DELTASONE) 20 MG tablet, Take 2 tablets (40 mg total) by mouth daily with breakfast for 5 days., Disp: 10 tablet, Rfl: 0   promethazine-dextromethorphan (PROMETHAZINE-DM) 6.25-15 MG/5ML syrup, Take 5 mLs by mouth 4 (four) times daily as needed for cough., Disp: 118 mL, Rfl: 0    Spacer/Aero-Holding Chambers (AEROCHAMBER PLUS) inhaler, Use with inhaler, Disp: 1 each, Rfl: 2   ferrous sulfate 325 (65 FE) MG tablet, Take 1 tablet (325 mg total) by mouth daily., Disp: 30 tablet, Rfl: 0  No Known Allergies   ROS  As noted in HPI.   Physical Exam  BP (!) 150/84 (BP Location: Left Arm)    Pulse 78    Temp 99 F (37.2 C) (Oral)    Resp 18    LMP 01/02/2021    SpO2 100%   Constitutional: Well developed, well nourished, no acute distress.  Coughing. Eyes:  EOMI, conjunctiva normal bilaterally HENT: Normocephalic, atraumatic,mucus membranes moist Respiratory: Normal inspiratory effort, lungs clear bilaterally Cardiovascular: Normal rate, regular rhythm, no murmurs, rubs, gallops GI: nondistended skin: No rash, skin intact Musculoskeletal: no deformities Neurologic: Alert & oriented x 3, no focal neuro deficits Psychiatric: Speech and behavior appropriate   ED Course   Medications - No data to display  No orders of the defined types were placed in this encounter.   No results found for this or any previous visit (from the past 24 hour(s)). No results found.  ED Clinical Impression  1. COVID-19 virus infection   2. Acute cough      ED Assessment/Plan  Patient with residual cough post-COVID.  No evidence  of pneumonia.  We will refill her albuterol inhaler, prescribe a spacer and send her home with Promethazine DM.  40 mg of prednisone for 5 days since she has a history of asthma and her cough could be because of an early asthma exacerbation.  Work note to clear her to go back to work per [employer]. follow-up with PMD as needed.  Discussed MDM, treatment plan, and plan for follow-up with patient. patient agrees with plan.   Meds ordered this encounter  Medications   predniSONE (DELTASONE) 20 MG tablet    Sig: Take 2 tablets (40 mg total) by mouth daily with breakfast for 5 days.    Dispense:  10 tablet    Refill:  0   albuterol (VENTOLIN HFA)  108 (90 Base) MCG/ACT inhaler    Sig: Inhale 1-2 puffs into the lungs every 4 (four) hours as needed for wheezing or shortness of breath.    Dispense:  1 each    Refill:  0   Spacer/Aero-Holding Chambers (AEROCHAMBER PLUS) inhaler    Sig: Use with inhaler    Dispense:  1 each    Refill:  2    Please educate patient on use   promethazine-dextromethorphan (PROMETHAZINE-DM) 6.25-15 MG/5ML syrup    Sig: Take 5 mLs by mouth 4 (four) times daily as needed for cough.    Dispense:  118 mL    Refill:  0      *This clinic note was created using Scientist, clinical (histocompatibility and immunogenetics). Therefore, there may be occasional mistakes despite careful proofreading.  ?    Domenick Gong, MD 01/11/21 [phone]

## 2021-01-11 NOTE — ED Triage Notes (Signed)
Pt presents for note to return to work after being sick.

## 2021-01-11 NOTE — Discharge Instructions (Addendum)
2 puffs from your albuterol inhaler using your spacer every 4-6 hours as needed for coughing.  You can also try the prednisone, but I would give the albuterol a chance to work first.  Promethazine DM will help with the cough as well.

## 2021-07-20 IMAGING — CR DG CHEST 2V
2 series · 2 of 2 positions shown · non-contrast
Comparison: 04/05/2019

CLINICAL DATA: Cough and short of breath

EXAM:
CHEST - 2 VIEW

[chest pa]
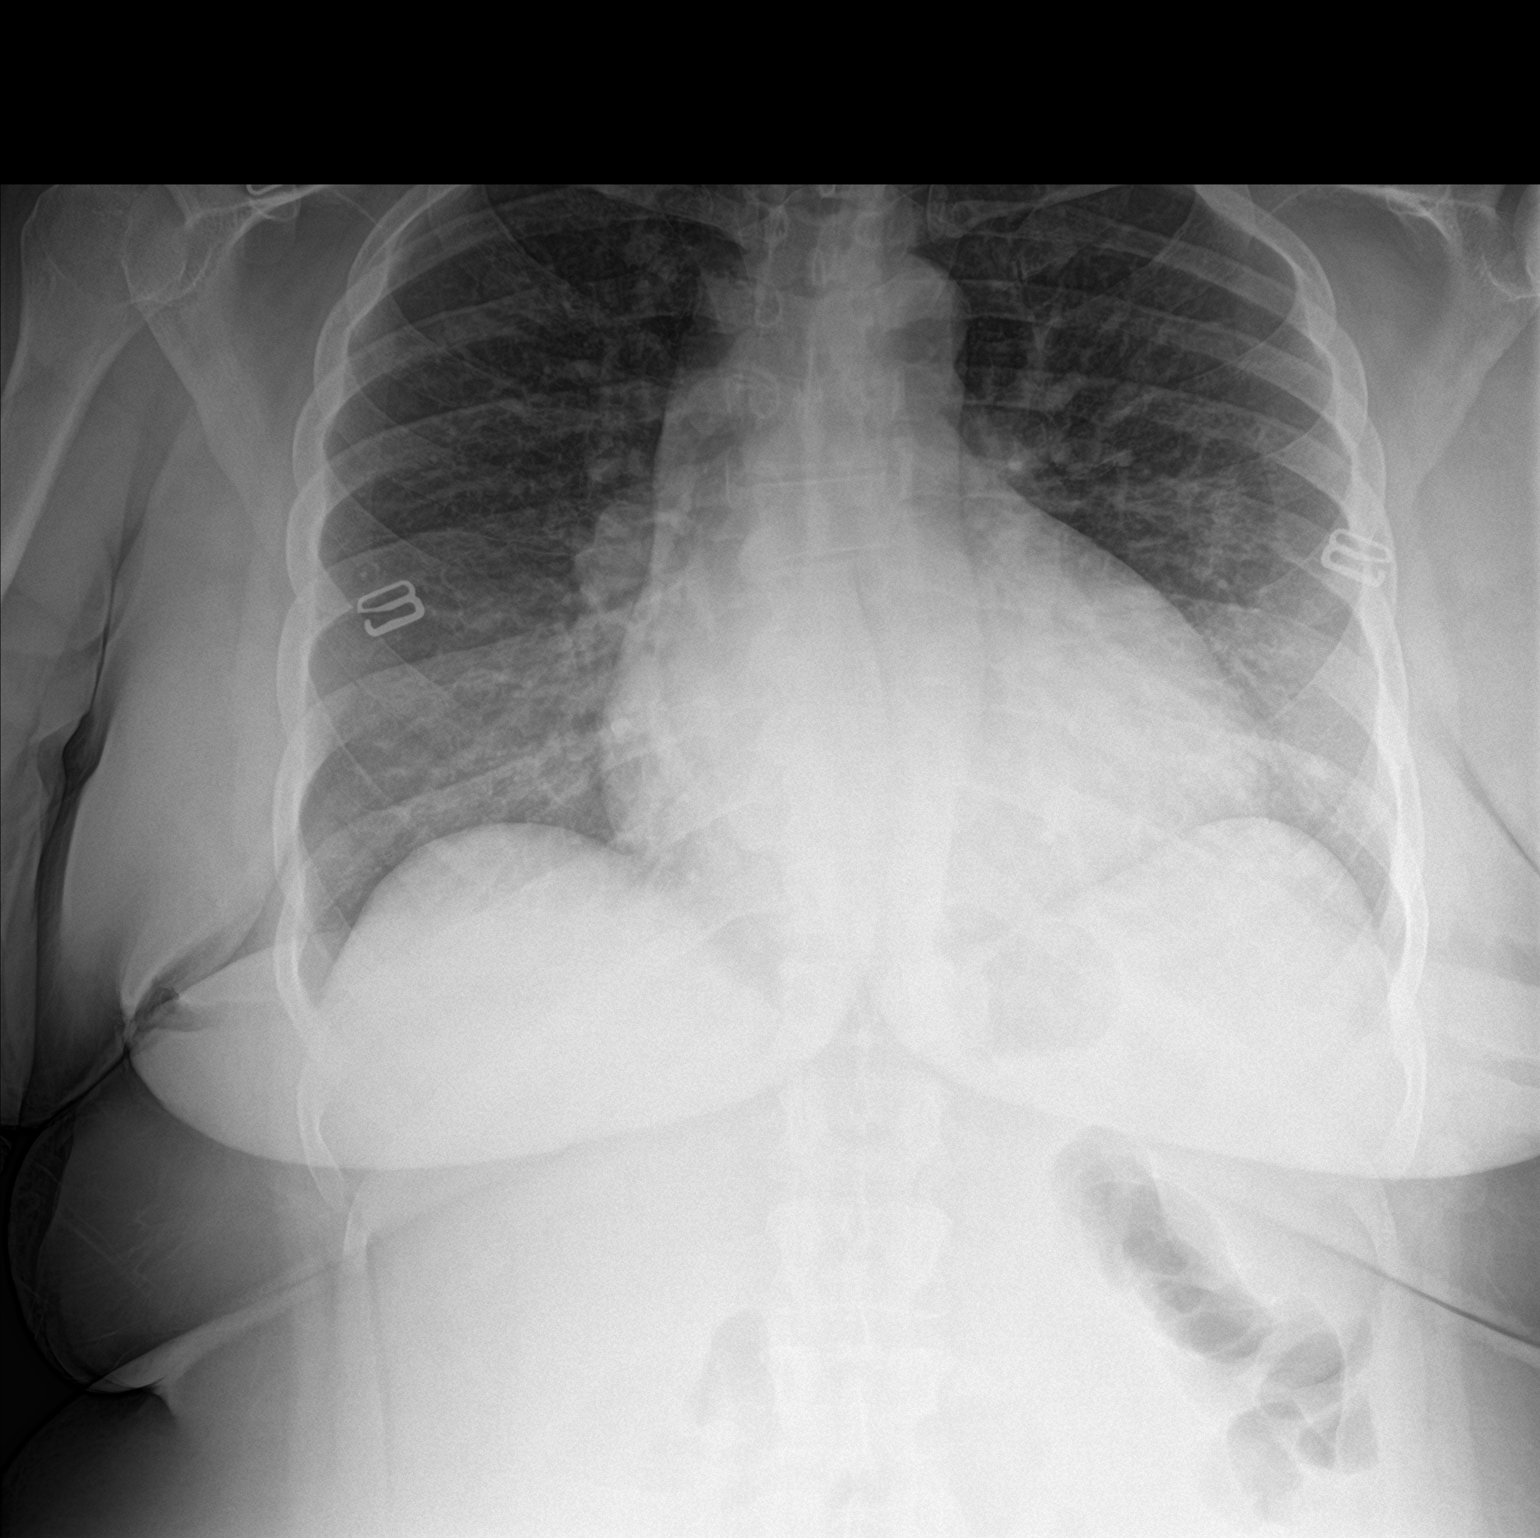

[chest lat]
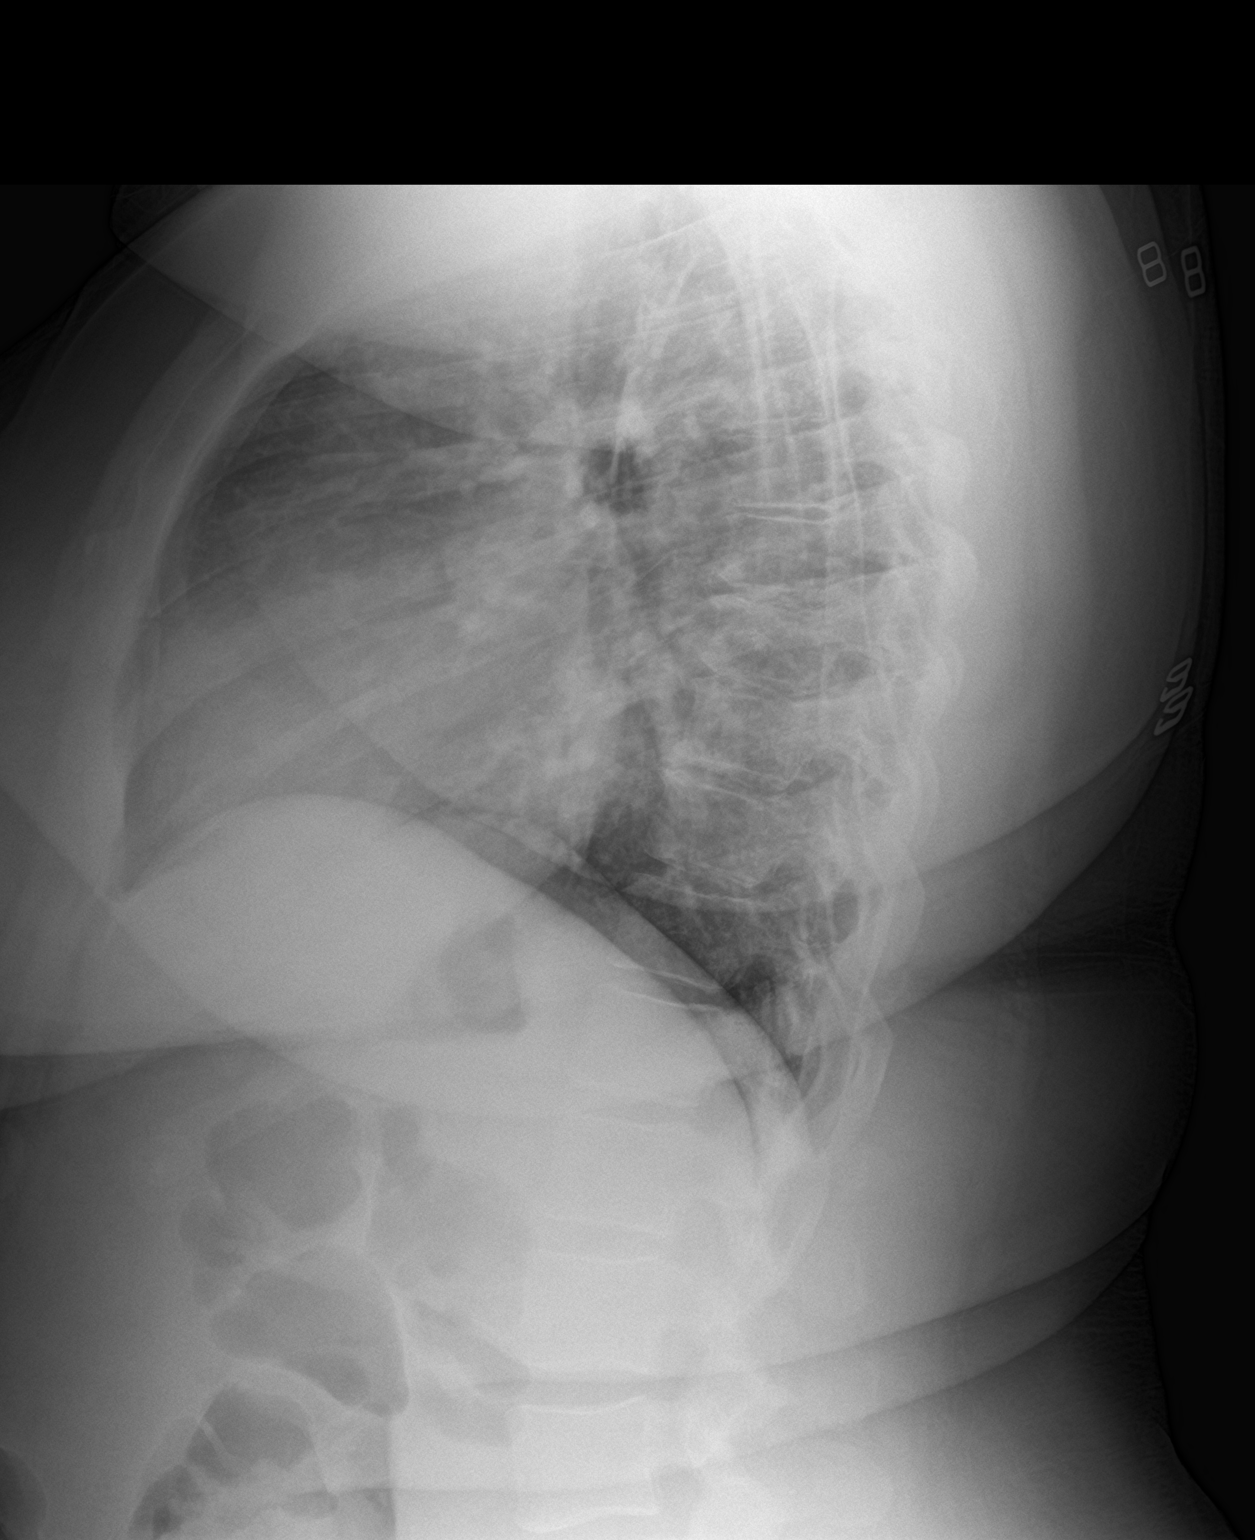

[2 of 2 positions shown; findings below may reference images not displayed]

FINDINGS: Improved aeration bilaterally. No focal consolidation or pleural
effusion. Mild cardiomegaly. No pneumothorax.
IMPRESSION: Improved aeration bilaterally without consolidative pneumonia. Mild
cardiomegaly
